# Patient Record
Sex: Male | Born: 1960 | Race: White | Hispanic: No | Marital: Single | Smoking: Never smoker
Health system: Southern US, Community
[De-identification: ages and names within clinical notes are randomized; demographics above are authoritative.]

## PROBLEM LIST (undated history)

## (undated) DIAGNOSIS — E785 Hyperlipidemia, unspecified: Secondary | ICD-10-CM

## (undated) DIAGNOSIS — I10 Essential (primary) hypertension: Secondary | ICD-10-CM

## (undated) DIAGNOSIS — M62838 Other muscle spasm: Secondary | ICD-10-CM

## (undated) DIAGNOSIS — H547 Unspecified visual loss: Secondary | ICD-10-CM

## (undated) DIAGNOSIS — K219 Gastro-esophageal reflux disease without esophagitis: Secondary | ICD-10-CM

## (undated) DIAGNOSIS — E669 Obesity, unspecified: Secondary | ICD-10-CM

## (undated) DIAGNOSIS — G931 Anoxic brain damage, not elsewhere classified: Secondary | ICD-10-CM

## (undated) HISTORY — DX: Hyperlipidemia, unspecified: E78.5

## (undated) HISTORY — DX: Essential (primary) hypertension: I10

## (undated) HISTORY — DX: Gastro-esophageal reflux disease without esophagitis: K21.9

## (undated) HISTORY — DX: Anoxic brain damage, not elsewhere classified: G93.1

## (undated) HISTORY — PX: CHOLECYSTECTOMY: SHX55

## (undated) HISTORY — PX: OTHER SURGICAL HISTORY: SHX169

## (undated) HISTORY — DX: Obesity, unspecified: E66.9

## (undated) HISTORY — DX: Unspecified visual loss: H54.7

---

## 1999-12-23 ENCOUNTER — Ambulatory Visit (HOSPITAL_COMMUNITY): Admission: RE | Admit: 1999-12-23 | Discharge: 1999-12-23 | Payer: Self-pay | Admitting: Neurosurgery

## 1999-12-23 ENCOUNTER — Encounter: Payer: Self-pay | Admitting: Neurosurgery

## 1999-12-24 ENCOUNTER — Ambulatory Visit (HOSPITAL_COMMUNITY): Admission: RE | Admit: 1999-12-24 | Discharge: 1999-12-24 | Payer: Self-pay | Admitting: Neurosurgery

## 2002-05-27 ENCOUNTER — Encounter: Payer: Self-pay | Admitting: Family Medicine

## 2002-05-27 ENCOUNTER — Ambulatory Visit (HOSPITAL_COMMUNITY): Admission: RE | Admit: 2002-05-27 | Discharge: 2002-05-27 | Payer: Self-pay | Admitting: Family Medicine

## 2002-05-29 ENCOUNTER — Encounter: Admission: RE | Admit: 2002-05-29 | Discharge: 2002-05-29 | Payer: Self-pay | Admitting: Neurosurgery

## 2002-05-29 ENCOUNTER — Encounter: Payer: Self-pay | Admitting: Neurosurgery

## 2003-02-20 ENCOUNTER — Encounter: Payer: Self-pay | Admitting: Neurosurgery

## 2003-02-20 ENCOUNTER — Encounter: Admission: RE | Admit: 2003-02-20 | Discharge: 2003-02-20 | Payer: Self-pay | Admitting: Neurosurgery

## 2003-02-25 ENCOUNTER — Ambulatory Visit (HOSPITAL_COMMUNITY): Admission: RE | Admit: 2003-02-25 | Discharge: 2003-02-25 | Payer: Self-pay | Admitting: Neurosurgery

## 2003-09-14 ENCOUNTER — Emergency Department (HOSPITAL_COMMUNITY): Admission: EM | Admit: 2003-09-14 | Discharge: 2003-09-14 | Payer: Self-pay | Admitting: Emergency Medicine

## 2003-09-16 ENCOUNTER — Ambulatory Visit (HOSPITAL_COMMUNITY): Admission: RE | Admit: 2003-09-16 | Discharge: 2003-09-16 | Payer: Self-pay | Admitting: Sports Medicine

## 2004-09-03 ENCOUNTER — Ambulatory Visit (HOSPITAL_COMMUNITY): Admission: RE | Admit: 2004-09-03 | Discharge: 2004-09-03 | Payer: Self-pay | Admitting: Family Medicine

## 2004-10-22 ENCOUNTER — Encounter: Admission: RE | Admit: 2004-10-22 | Discharge: 2004-10-22 | Payer: Self-pay | Admitting: Neurosurgery

## 2006-10-11 ENCOUNTER — Ambulatory Visit: Payer: Self-pay | Admitting: Orthopedic Surgery

## 2007-10-08 ENCOUNTER — Encounter: Admission: RE | Admit: 2007-10-08 | Discharge: 2007-10-08 | Payer: Self-pay | Admitting: Neurosurgery

## 2009-05-06 ENCOUNTER — Inpatient Hospital Stay (HOSPITAL_COMMUNITY): Admission: EM | Admit: 2009-05-06 | Discharge: 2009-05-08 | Payer: Self-pay | Admitting: Emergency Medicine

## 2009-05-08 ENCOUNTER — Encounter (INDEPENDENT_AMBULATORY_CARE_PROVIDER_SITE_OTHER): Payer: Self-pay | Admitting: General Surgery

## 2011-01-08 LAB — HEPATIC FUNCTION PANEL
ALT: 177 U/L — ABNORMAL HIGH (ref 0–53)
ALT: 287 U/L — ABNORMAL HIGH (ref 0–53)
AST: 179 U/L — ABNORMAL HIGH (ref 0–37)
AST: 199 U/L — ABNORMAL HIGH (ref 0–37)
Albumin: 3.3 g/dL — ABNORMAL LOW (ref 3.5–5.2)
Albumin: 4.2 g/dL (ref 3.5–5.2)
Alkaline Phosphatase: 132 U/L — ABNORMAL HIGH (ref 39–117)
Alkaline Phosphatase: 169 U/L — ABNORMAL HIGH (ref 39–117)
Bilirubin, Direct: 0.3 mg/dL (ref 0.0–0.3)
Bilirubin, Direct: 0.7 mg/dL — ABNORMAL HIGH (ref 0.0–0.3)
Indirect Bilirubin: 0.7 mg/dL (ref 0.3–0.9)
Indirect Bilirubin: 0.8 mg/dL (ref 0.3–0.9)
Total Bilirubin: 1.1 mg/dL (ref 0.3–1.2)
Total Bilirubin: 1.4 mg/dL — ABNORMAL HIGH (ref 0.3–1.2)
Total Protein: 6.4 g/dL (ref 6.0–8.3)
Total Protein: 7.6 g/dL (ref 6.0–8.3)

## 2011-01-08 LAB — POCT CARDIAC MARKERS
CKMB, poc: <1 ng/mL — ABNORMAL LOW (ref 1.0–8.0)
CKMB, poc: <1 ng/mL — ABNORMAL LOW (ref 1.0–8.0)
Myoglobin, poc: 66.1 ng/mL (ref 12–200)
Myoglobin, poc: 79.4 ng/mL (ref 12–200)
Troponin i, poc: <0.05 ng/mL (ref 0.00–0.09)
Troponin i, poc: <0.05 ng/mL (ref 0.00–0.09)

## 2011-01-08 LAB — GLUCOSE, CAPILLARY
Glucose-Capillary: 123 mg/dL — ABNORMAL HIGH (ref 70–99)
Glucose-Capillary: 136 mg/dL — ABNORMAL HIGH (ref 70–99)

## 2011-01-08 LAB — CBC
HCT: 38.2 % — ABNORMAL LOW (ref 39.0–52.0)
HCT: 44.6 % (ref 39.0–52.0)
Hemoglobin: 13.2 g/dL (ref 13.0–17.0)
Hemoglobin: 15.5 g/dL (ref 13.0–17.0)
MCHC: 34.5 g/dL (ref 30.0–36.0)
MCHC: 34.8 g/dL (ref 30.0–36.0)
MCV: 85.4 fL (ref 78.0–100.0)
MCV: 85.8 fL (ref 78.0–100.0)
Platelets: 229 10*3/uL (ref 150–400)
Platelets: 251 10*3/uL (ref 150–400)
RBC: 4.47 MIL/uL (ref 4.22–5.81)
RBC: 5.2 MIL/uL (ref 4.22–5.81)
RDW: 13.8 % (ref 11.5–15.5)
RDW: 14 % (ref 11.5–15.5)
WBC: 16.1 10*3/uL — ABNORMAL HIGH (ref 4.0–10.5)
WBC: 21.2 10*3/uL — ABNORMAL HIGH (ref 4.0–10.5)

## 2011-01-08 LAB — BASIC METABOLIC PANEL
BUN: 12 mg/dL (ref 6–23)
BUN: 12 mg/dL (ref 6–23)
CO2: 28 mEq/L (ref 19–32)
CO2: 30 mEq/L (ref 19–32)
Calcium: 8.8 mg/dL (ref 8.4–10.5)
Calcium: 9.5 mg/dL (ref 8.4–10.5)
Chloride: 105 mEq/L (ref 96–112)
Chloride: 99 mEq/L (ref 96–112)
Creatinine, Ser: 0.81 mg/dL (ref 0.4–1.5)
Creatinine, Ser: 1.01 mg/dL (ref 0.4–1.5)
GFR calc Af Amer: >60 mL/min (ref >60)
GFR calc Af Amer: >60 mL/min (ref >60)
GFR calc non Af Amer: >60 mL/min (ref >60)
GFR calc non Af Amer: >60 mL/min (ref >60)
Glucose, Bld: 127 mg/dL — ABNORMAL HIGH (ref 70–99)
Glucose, Bld: 209 mg/dL — ABNORMAL HIGH (ref 70–99)
Potassium: 2.9 mEq/L — ABNORMAL LOW (ref 3.5–5.1)
Potassium: 3.1 mEq/L — ABNORMAL LOW (ref 3.5–5.1)
Sodium: 140 mEq/L (ref 135–145)
Sodium: 142 mEq/L (ref 135–145)

## 2011-01-08 LAB — DIFFERENTIAL
Basophils Absolute: 0 10*3/uL (ref 0.0–0.1)
Basophils Absolute: 0 10*3/uL (ref 0.0–0.1)
Basophils Relative: 0 % (ref 0–1)
Basophils Relative: 0 % (ref 0–1)
Eosinophils Absolute: 0 10*3/uL (ref 0.0–0.7)
Eosinophils Absolute: 0 10*3/uL (ref 0.0–0.7)
Eosinophils Relative: 0 % (ref 0–5)
Eosinophils Relative: 0 % (ref 0–5)
Lymphocytes Relative: 12 % (ref 12–46)
Lymphocytes Relative: 3 % — ABNORMAL LOW (ref 12–46)
Lymphs Abs: 0.7 10*3/uL (ref 0.7–4.0)
Lymphs Abs: 2 10*3/uL (ref 0.7–4.0)
Monocytes Absolute: 0.5 10*3/uL (ref 0.1–1.0)
Monocytes Absolute: 0.9 10*3/uL (ref 0.1–1.0)
Monocytes Relative: 2 % — ABNORMAL LOW (ref 3–12)
Monocytes Relative: 6 % (ref 3–12)
Neutro Abs: 13.2 10*3/uL — ABNORMAL HIGH (ref 1.7–7.7)
Neutro Abs: 20 10*3/uL — ABNORMAL HIGH (ref 1.7–7.7)
Neutrophils Relative %: 82 % — ABNORMAL HIGH (ref 43–77)
Neutrophils Relative %: 94 % — ABNORMAL HIGH (ref 43–77)

## 2011-01-08 LAB — POTASSIUM: Potassium: 3.7 mEq/L (ref 3.5–5.1)

## 2011-01-08 LAB — LIPASE, BLOOD: Lipase: 19 U/L (ref 11–59)

## 2011-02-15 NOTE — Discharge Summary (Signed)
NAME:  [name], [name] NO.:  [id number]   MEDICAL RECORD NO.:  [id number]          PATIENT TYPE:  INP   LOCATION:  A308                          FACILITY:  [facility]   PHYSICIAN:  Dalia Heading, M.D.  DATE OF BIRTH:  Sep 22, 1961   DATE OF ADMISSION:  05/06/2009  DATE OF DISCHARGE:  08/06/2010LH                               DISCHARGE SUMMARY   HOSPITAL COURSE SUMMARY:  The patient is a 50 year old white male with  multiple medical problems, who presented with upper abdominal pain,  nausea, vomiting.  He was found on ultrasound of the gallbladder to have  acute cholecystitis with cholelithiasis.  His liver enzyme tests were  noted to be elevated.  Just prior to the surgery, his total bilirubin  returned to normal.  He subsequently went to the operating room on  May 08, 2009 underwent laparoscopic cholecystectomy.  He tolerated the  procedure well.  His postoperative course has been unremarkable.  His  diet was advanced without difficulty.   The patient is being discharged home or back to the group home in good  and stable condition.   FOLLOWUP:  The patient is to follow up Dr. Franky Macho on May 14, 2009.   Discharge medications include:  1. Darvocet-N 100 one-two tablets p.o. q.6 h p.r.n. pain.  2. Multivitamin 1 tablet p.o. daily.  3. Baby aspirin 81 mg p.o. daily.  4. HydroDIURIL/hydrochlorothiazide 25-12 or 25 mg p.o. daily.  5. Lisinopril 10 mg p.o. daily.  6. Prilosec 20 mg p.o. daily.  7. Meclizine 12.5 mg p.o. b.i.d.  8. Ibuprofen 600 mg p.o. q.8 h p.r.n.  9. Tylenol 1 tablet p.o. q.6 h p.r.n.  10.Lipitor 40 mg p.o. nightly.  11.Flexeril 10 mg p.o. q.8 h p.r.n.   PRINCIPAL DIAGNOSES:  1. Cholecystitis, cholelithiasis.  2. Anoxic brain injury.  3. Blindness.  4. Hypertension.  5. Gastric reflux disease.   PRINCIPAL PROCEDURE:  Laparoscopic cholecystectomy on May 08, 2009.      Dalia Heading, M.D.  Electronically Signed     MAJ/MEDQ  D:  05/08/2009  T:  05/08/2009  Job:  [id number]

## 2011-02-15 NOTE — Op Note (Signed)
[name], [name] NO.:  [id number]   MEDICAL RECORD NO.:  [id number]          PATIENT TYPE:  INP   LOCATION:  A308                          FACILITY:  [facility]   PHYSICIAN:  Dalia Heading, M.D.  DATE OF BIRTH:  [birth year]   DATE OF PROCEDURE:  05/08/2009  DATE OF DISCHARGE:  05/08/2009                               OPERATIVE REPORT   PREOPERATIVE DIAGNOSES:  Cholecystitis, cholelithiasis.   POSTOPERATIVE DIAGNOSES:  Cholecystitis, cholelithiasis.   PROCEDURE:  Laparoscopic cholecystectomy.   SURGEON:  Dalia Heading, MD   ANESTHESIA:  General endotracheal.   INDICATIONS:  The patient is a 50 year old white male who presented to  the emergency room with several-day history of worsening right upper  quadrant abdominal pain, nausea, vomiting.  Ultrasound of the  gallbladder revealed cholecystitis.  He did have liver enzyme tests that  were elevated.  His total bilirubin was normal just prior to the  surgery.  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  The risks and benefits  of the procedure including bleeding, infection, hepatobiliary injury,  and the possibility of an open procedure were fully explained to the  patient's mother, who gave informed consent for the patient.   PROCEDURE NOTE:  The patient was placed in supine position.  After  induction of general endotracheal anesthesia, the abdomen was prepped  and draped using the usual sterile technique with Betadine.  Surgical  site confirmation was performed.   A supraumbilical incision was made down to the fascia.  A Veress needle  was introduced into the abdominal cavity and confirmation of placement  was done using the saline drop test.  The abdomen was then insufflated  to 16 mmHg pressure.  An 11-mm trocar was introduced into the abdominal  cavity under direct visualization without difficulty.  The patient was  placed in  reversed Trendelenburg position.  An additional 11-mm trocar  was placed in the epigastric region and a 5-mm trocar was placed in the  right upper quadrant, right flank regions.  The liver was inspected and  noted to be within normal limits.  The gallbladder was noted to be  acutely inflamed and friable.  Care was taken to avoid the VP shunt that  was in the abdomen.  The gallbladder was retracted superiorly and  laterally.  The dissection was begun around the infundibulum of the  gallbladder.  The cystic duct was first identified.  Junction to the  infundibulum fully identified.  Due to its friability, a vascular Endo-  GIA was placed across the base of the cystic duct and fired.  The cystic  artery was likewise ligated divided using EndoClips.  The gallbladder  was then freed away from the gallbladder fossa using Bovie  electrocautery.  Gallbladder was then removed using an Endocatch bag  without difficulty.  Multiple small granular stones were also removed  using suction.  The abdomen was copiously irrigated with normal saline.  All fluid and air were then evacuated from the abdominal cavity prior to  removal of the trocars.   All wounds were irrigated with normal saline.  All wounds were injected  with 0.5% Sensorcaine.  The supraumbilical fascia as well as epigastric  fascia were reapproximated using 0 Vicryl interrupted sutures.  All skin  incisions were closed using staples.  Betadine ointment and dry sterile  dressings were applied.   All tape and needle counts were correct at the end of the procedure.  The patient was extubated in the operating room and went back to  recovery room in awake and stable condition.   COMPLICATIONS:  None.   SPECIMEN:  Gallbladder.   BLOOD LOSS:  Minimal.      Dalia Heading, M.D.  Electronically Signed     MAJ/MEDQ  D:  05/08/2009  T:  05/08/2009  Job:  [id number]   cc:   [facility]

## 2011-02-15 NOTE — H&P (Signed)
NAME:  [name], [name] NO.:  [id number]   MEDICAL RECORD NO.:  [id number]          PATIENT TYPE:  INP   LOCATION:  A308                          FACILITY:  [facility]   PHYSICIAN:  Dalia Heading, M.D.  DATE OF BIRTH:  [birth year]   DATE OF ADMISSION:  05/06/2009  DATE OF DISCHARGE:  LH                              HISTORY & PHYSICAL   CHIEF COMPLAINT:  Right upper quadrant abdominal pain.   HISTORY OF PRESENT ILLNESS:  The patient is a 50 year old, white male,  who presented to the emergency room for workup of upper abdominal pain.  He states he started having the abdominal pain recently, but he is not  exactly sure when.  Workup including an ultrasound of the gallbladder  revealed cholecystitis with cholelithiasis.  Common bile duct is within  normal limits.  His liver enzyme tests were also noted to be elevated.   The patient had anoxic brain injury as a child and history is limited.  His mother had stepped out from the room at the time of my examination.   PAST MEDICAL HISTORY:  Anoxic brain injury, blindness, hypertension,  gastric reflux disease.   PAST SURGICAL HISTORY:  Ventriculoperitoneal shunt in the remote past.   CURRENT MEDICATIONS:  1. Hydrochlorothiazide 25 mg p.o. daily.  2. Baby aspirin 1 tablet p.o. daily.  3. Prilosec 20 mg p.o. b.i.d.  4. Lipitor 40 mg p.o. nightly.  5. Ibuprofen 600 mg p.r.n.  6. Antivert 12.5 mg p.o. p.r.n.  7. Flexeril 10 mg p.o. p.r.n.   ALLERGIES:  PENICILLIN.   REVIEW OF SYSTEMS:  The patient does not drink or smoke.   PHYSICAL EXAMINATION:  GENERAL:  The patient is a well-developed, well-  nourished, white male, in no acute distress.  He is afebrile and vital  signs are stable.  LUNGS:  Clear to auscultation with equal breath sounds bilaterally.  HEART:  Regular rate and rhythm without S3, S4, or murmurs.  ABDOMEN:  Soft with tenderness down the right upper quadrant to  palpation.  No hepatosplenomegaly, masses,  hernias are noted.  A right  upper quadrant surgical scar is present, though it is difficult to  palpate the shunt.   White blood cell count 21.2, hemoglobin 15.5, hematocrit 44.6, platelet  count 251.  MET-7 is remarkable for a potassium of 3.1, glucose 209.  SGOT is 199, SGPT 177, alkaline phosphatase 169, total bilirubin 1.4,  direct 0.7, lipase 19.   Chest x-ray and abdominal films reveal clear lung fields.  The abdomen  reveals a VP shunt with the tip in the lower midabdomen.   IMPRESSION:  Cholecystitis, cholelithiasis.   PLAN:  The patient would be admitted to the hospital for IV antibiotic  therapy and bowel rest.  He subsequently will need laparoscopic  cholecystectomy with possible cholangiograms.  The risks and benefits of  the procedure including bleeding, infection, hepatobiliary injury, the  possibility of an open procedure will be fully explained to the  patient's mother prior to surgical intervention.      Dalia Heading, M.D.  Electronically Signed  MAJ/MEDQ  D:  05/06/2009  T:  05/07/2009  Job:  [id number]

## 2011-02-18 NOTE — Op Note (Signed)
[facility]  Patient:    [name], [name]                        MRN: [id number] Proc. Date: 12/24/99 Adm. Date:  [id number] Disc. Date: [id number] Attending:  Emeterio Reeve Dictator:   Payton Doughty, M.D.                           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  The CT was unchanged.  PROCEDURE:  Shunt tap.  DESCRIPTION OF PROCEDURE:  Following shave, prep, and drape in the usual sterile fashion, the ______ reservoir was accessed with a 23 gauge butterfly.  The pressure was very low.  Aspiration of cerebrospinal fluid showed clear fluid which was sent for cells, glucose, and protein.  He had good runoff down to a pressure of 0.  He had no complications.  He is being sent home with a follow up in my office. DD:  12/24/99 TD:  12/26/99 Job: [id number]

## 2011-02-18 NOTE — Op Note (Signed)
   NAME:  [name], [name] NO.:  [id number]   MEDICAL RECORD NO.:  [id number]                   PATIENT TYPE:  OIB   LOCATION:  2892                                 FACILITY:  [facility]   PHYSICIAN:  Payton Doughty, M.D.                   DATE OF BIRTH:  10/01/1961   DATE OF PROCEDURE:  02/25/2003  DATE OF DISCHARGE:                                 OPERATIVE REPORT   The patient is a 49 year old gentleman with shunted hydrocephalus with  neurologic change, for shunt tap.   DESCRIPTION OF PROCEDURE:  The area over the [place] reservoir was shaved  and prepped with Betadine solution.  A 23 gauge butterfly needle was  inserted into it.  The pressure was negligible.  There was CSF evacuated and  sent for cells and glucose and protein.  The pressure allowed to drop  quickly reached a height of less than the needle in the reservoir.  The  needle was withdrawn and a bandage placed.  The patient left without  difficulty.                                               Payton Doughty, M.D.    MWR/MEDQ  D:  02/25/2003  T:  02/25/2003  Job:  [id number]

## 2012-12-20 ENCOUNTER — Encounter: Payer: Self-pay | Admitting: Urgent Care

## 2012-12-20 ENCOUNTER — Encounter (HOSPITAL_COMMUNITY): Payer: Self-pay | Admitting: Pharmacy Technician

## 2012-12-20 ENCOUNTER — Other Ambulatory Visit: Payer: Self-pay | Admitting: Gastroenterology

## 2012-12-20 ENCOUNTER — Ambulatory Visit (INDEPENDENT_AMBULATORY_CARE_PROVIDER_SITE_OTHER): Payer: Medicare Other | Admitting: Urgent Care

## 2012-12-20 VITALS — BP 129/84 | HR 65 | Temp 97.6°F | Ht 66.0 in | Wt 226.4 lb

## 2012-12-20 DIAGNOSIS — R1310 Dysphagia, unspecified: Secondary | ICD-10-CM

## 2012-12-20 DIAGNOSIS — R1319 Other dysphagia: Secondary | ICD-10-CM

## 2012-12-20 DIAGNOSIS — K219 Gastro-esophageal reflux disease without esophagitis: Secondary | ICD-10-CM

## 2012-12-20 NOTE — Progress Notes (Signed)
Referring Provider: [name], MD Primary Care Physician:  [name], MD Primary Gastroenterologist:  [name]  Chief Complaint  Patient presents with  . Heartburn  . Dysphagia    HPI:  [name] is a 52 y.o. male here as a referral from Dr. Luster Landsberg for GERD.  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  He presents with mother & caregiver today.  Hx chronic GERD, worse lately.  C/o heartburn & sour belches all the time.  Mom says smells so bad she has to leave the room.  On omeprazole 20mg  BID for years.  C/o feeling like something stuck in chest & upper esophagus when eating.  He feels like he needs a drink to flush food down his esophagus after eating.   Caregiver notes he is taking IBU 400mg  every 8hrs prn headaches.  Weight steadily increasing.  Appetite is fine.   Denies constipation, diarrhea, rectal bleeding, melena or weight loss.  Past Medical History  Diagnosis Date  . Hyperlipidemia   . Obesity   . Hypertension   . Blind   . Hypoxic brain injury   . GERD (gastroesophageal reflux disease)     Past Surgical History  Procedure Laterality Date  . Cholecystectomy    . High pressure shunt      Current Outpatient Prescriptions  Medication Sig Dispense Refill  . acetaminophen (TYLENOL) 500 MG tablet Take 500 mg by mouth every 6 (six) hours as needed for pain.      Marland Kitchen aspirin 81 MG tablet Take 81 mg by mouth daily.      Marland Kitchen atorvastatin (LIPITOR) 40 MG tablet Take 40 mg by mouth daily.       . clonazePAM (KLONOPIN) 0.5 MG tablet Take 0.5 mg by mouth 2 (two) times daily as needed.       . cyclobenzaprine (FLEXERIL) 10 MG tablet Take 10 mg by mouth 3 (three) times daily as needed for muscle spasms.      . fenofibrate (TRICOR) 145 MG tablet Take 145 mg by mouth daily.       . hydrochlorothiazide (HYDRODIURIL) 25 MG tablet Take 25 mg by mouth daily.       Marland Kitchen ibuprofen (ADVIL,MOTRIN) 400 MG tablet Take 400 mg by mouth every 8 (eight) hours as needed.       .  meclizine (ANTIVERT) 25 MG tablet Take 12.5 mg by mouth 2 (two) times daily as needed.      . Multiple Vitamin (MULTIVITAMIN) capsule Take 1 capsule by mouth daily.      Marland Kitchen omeprazole (PRILOSEC) 20 MG capsule Take 20 mg by mouth 2 (two) times daily.       . potassium chloride (K-DUR) 10 MEQ tablet Take 10 mEq by mouth daily.       . pseudoephedrine-brompheniramine-dextromethorphan (CARBOFED DM) 45-4-15 MG/5ML syrup Take 5 mLs by mouth 4 (four) times daily as needed for cough.      . zolpidem (AMBIEN) 5 MG tablet Take 2.5 mg by mouth at bedtime as needed.        No current facility-administered medications for this visit.    Allergies as of 12/20/2012 - Review Complete 12/20/2012  Allergen Reaction Noted  . Penicillins Hives 12/20/2012    Family History:There is no known family history of colorectal carcinoma , liver disease, or inflammatory bowel disease.  Problem Relation Age of Onset  . Breast cancer Sister   . Breast cancer Mother   . Kidney failure Father  History   Social History  . Marital Status: Single    Spouse Name: N/A    Number of Children: 0  . Years of Education: N/A   Occupational History  . disabled    Social History Main Topics  . Smoking status: Never Smoker   . Smokeless tobacco: Not on file  . Alcohol Use: No  . Drug Use: No  . Sexually Active: Not on file   Other Topics Concern  . Not on file   Social History Narrative   Res[facility]    Review of Systems: Gen: Denies any fever, chills, sweats, anorexia, fatigue, weakness, malaise, weight loss, and sleep disorder CV: Denies chest pain, angina, palpitations, syncope, orthopnea, PND, peripheral edema, and claudication. Resp: Denies dyspnea at rest, dyspnea with exercise, cough, sputum, wheezing, coughing up blood, and pleurisy. GI: Denies vomiting blood, jaundice, and fecal incontinence.    GU : Denies urinary burning, blood in urine, urinary frequency, urinary hesitancy,  nocturnal urination, and urinary incontinence. MS: Denies joint pain, limitation of movement, and swelling, stiffness, low back pain, extremity pain. Denies muscle weakness, cramps, atrophy.  Derm: Denies rash, itching, dry skin, hives, moles, warts, or unhealing ulcers.  Psych: Denies depression, anxiety, memory loss, suicidal ideation, hallucinations, paranoia, and confusion. Heme: Denies bruising, bleeding, and enlarged lymph nodes. Neuro:  Denies any headaches, dizziness, paresthesias. Endo:  Denies any problems with DM, thyroid, adrenal function.  Physical Exam: BP 129/84  Pulse 65  Temp(Src) 97.6 F (36.4 C) (Oral)  Ht 5\' 6"  (1.676 m)  Wt 226 lb 6.4 oz (102.694 kg)  BMI 36.56 kg/m2 No LMP for male patient. General:   Alert,  Well-developed, pleasant and cooperative in NAD.  Accompanied by caregiver & mother.  +Wheelchair. Head:  +shunt Eyes:  Sclera clear, no icterus.   Conjunctiva pink.  +BLIND. Ears:  Normal auditory acuity. Nose:  No deformity, discharge, or lesions. Mouth:  No deformity or lesions,oropharynx pink & moist. Neck:  Supple; no masses or thyromegaly. Lungs:  Clear throughout to auscultation.   No wheezes, crackles, or rhonchi. No acute distress. Heart:  Regular rate and rhythm; no murmurs, clicks, rubs,  or gallops. Abdomen:  Normal bowel sounds.  No bruits.  Soft, non-tender and non-distended without masses, hepatosplenomegaly or hernias noted.  No guarding or rebound tenderness.   Rectal:  Deferred. Msk:  Symmetrical without gross deformities. Normal posture. Pulses:  Normal pulses noted. Extremities:  No edema. Neurologic:  Alert and oriented x4 Skin:  Intact without significant lesions or rashes. Lymph Nodes:  No significant cervical adenopathy. Psych:  Alert and cooperative. Normal mood and affect.

## 2012-12-20 NOTE — Assessment & Plan Note (Signed)
[name] is a pleasant 52 y.o. male with poorly controlled GERD on BID omeprazole 20mg .  He also has dysphagia to solids & liquids.  He is taking a significant amount of Ibuprofen.  Differentials include refractory GERD, esophagitis, gastritis, or PUD.  EGD with Dr Darrick Penna.  I have discussed risks & benefits which include, but are not limited to, bleeding, infection, perforation & drug reaction.  The patient agrees with this plan & written consent will be obtained.

## 2012-12-20 NOTE — Progress Notes (Signed)
Faxed to PCP

## 2012-12-20 NOTE — Patient Instructions (Addendum)
EGD with possible esophageal dilation with Dr Darrick Penna STOP OMEPRAZOLE Begin Protonix 40mg  before breakfast & dinner. Dysphagia 2/GERD diet STOP IBUPROFEN, You may use tylenol instead Dysphagia Diet Level 2, Mechanically Altered This dysphagia mechanically altered diet is restricted to:  Foods that are moist, soft-textured, and easy to chew and swallow.  Meats that are ground or minced to no larger than -inch pieces. Meats are moist with gravy or sauce added.  Foods that do not include bread or bread-like textures except soft pancakes, well-moistened with syrup or sauce.  Textures with some chewing ability required.  Casseroles without rice.  Cooked vegetables that are less than -inch in size and easily mashed with a fork. No cooked corn, peas, broccoli, cauliflower, cabbage, Brussels sprouts, asparagus, or other fibrous, non-tender, or rubbery cooked vegetables.  Canned fruit except for pineapple. Fruit must be cut into no larger than -inch pieces.  Foods that do not include nuts, seeds, coconut, or sticky textures. FOOD TEXTURES Includes all foods listed on Dysphagia Diet Level 1, Pureed, in addition to the foods listed below. Beverages  Recommended: All beverages thickened to recommended consistency with minimal amounts of texture pulp. Any texture should be suspended in the liquid and should not fall out.  Avoid: All others.  You are currently limited to one of the following liquid consistency levels:  Thin.  Nectar-like.  Honey-like.  Spoon-thick. Breads  Recommended: Soft pancakes, well-moistened with syrup or sauce.  Avoid: All others. Cereals  Recommended: Cooked cereals with little texture, including oatmeal. Unprocessed wheat bran stirred into cereals for bulk. If thin liquids are restricted, it is important that all of the liquid is absorbed into the cereal.  Avoid: All dry cereals and any cooked cereals that may contain flax seeds or other seeds or nuts.  Whole-grain, dry, or coarse cereals. Cereals with nuts, seeds, dried fruit, or coconut. Desserts  Recommended: Pudding, custard. Soft fruit pies with bottom crust only. Canned fruit (excluding pineapple). Soft, moist cakes with icing.  Avoid: Dry, coarse cakes and cookies. Anything with nuts, seeds, coconut, pineapple, or dried fruit. Breakfast yogurt with nuts. Rice or bread pudding.  These foods are considered thin liquids and should be avoided if thin liquids are restricted:  Frozen malts, milk shakes, frozen yogurt, eggnog, nutritional supplements, ice cream, sherbet, regular or sugar-free gelatin, or any foods that become thin liquid at either room temperature, 70 F (21.1 C) or body temperature, 98 F (36.7 C). Fats  Recommended: Butter, margarine, cream for cereal (depending on liquid consistency recommendations), gravy, cream sauces, sour cream, sour cream dips with soft additives, mayonnaise, salad dressings, cream cheese, cream cheese spreads with soft additives, whipped toppings.  Avoid: All fats with coarse or chunky additives. Fruits  Recommended: Soft drained, canned, or cooked fruits without seeds or skin. Fresh soft and ripe banana. Fruit juices with a small amount of pulp. If thin liquids are restricted, fruit juices should be thickened to appropriate consistency.  Avoid: Fresh or frozen fruits. Cooked fruit with skin or seeds. Dried fruits. Fresh, canned, or cooked pineapple. Meats and Meat Substitutes Meat pieces should not exceed -inch cubes and should be tender.  Recommended: Moistened ground or cooked meat, poultry, or fish. Moist ground or tender meat may be served with gravy or sauce. Casseroles without rice. Moist macaroni and cheese, well-cooked pasta with meat sauce, tuna noodle casserole, soft, moist lasagna. Moist meatballs, meatloaf, or fish loaf. Protein salads, such as tuna or egg without large chunks, celery, or onion. Cottage cheese,  smooth quiche without  large chunks. Poached, scrambled, or soft-cooked eggs (egg yolks should not be "runny" but should be moist and able to be mashed with butter, margarine, or other moisture added to them). Cook eggs to 160 F (71.1 C) or use pasteurized eggs for safety. Souffls may have small, soft chunks. Tofu. Well-cooked, slightly mashed, moist legumes such as baked beans. All meats or protein substitutes should be served with sauces or moistened to help maintain cohesiveness in the mouth.  Avoid: Dry meats and tough meats, such as bacon, sausage, hot dogs, and bratwurst. Dry casseroles or casseroles with rice or large chunks. Peanut butter. Cheese slices and cubes. Hard-cooked or crisp fried eggs. Sandwiches. Pizza. Potatoes and Starches  Recommended: Well-cooked, moistened, boiled, baked, or mashed potatoes. Well-cooked shredded hash brown potatoes that are not crisp. All potatoes need to be moist and in sauces. Well-cooked noodles in sauce. Spaetzel or soft dumplings that have been moistened with butter or gravy.  Avoid: Potato skins and chips. Fried or French-fried potatoes. Rice. Soups  Recommended: Soups with easy-to-chew or easy-to-swallow meats or vegetables. Contents in soups should be less than -inch pieces. Soups will need to be thickened to appropriate consistency if soup is thinner than prescribed liquid consistency.  Avoid: Soups with large chunks of meat and vegetables. Soups with rice, corn, peas. Vegetables  Recommended: All soft, well-cooked vegetables. Vegetables should be less than -inch pieces. They should be easily mashed with a fork.  Avoid: Cooked corn and peas. Broccoli, cabbage, Brussels sprouts, asparagus, or other fibrous, non-tender, or rubbery cooked vegetables. Miscellaneous  Recommended: Jams and preserves without seeds, jelly. Sauces or salsas with small, tender, less than -inch pieces. Soft, smooth chocolate bars that are easily chewed.  Avoid: Seeds, nuts, coconut, or  sticky foods. Chewy candies such as caramels or licorice. Document Released: 09/19/2005 Document Revised: 12/12/2011 Document Reviewed: 10/12/2009 [facility] Patient Information 2013 [place], [place].  Diet for Gastroesophageal Reflux Disease, Adult Reflux (acid reflux) is when acid from your stomach flows up into the esophagus. When acid comes in contact with the esophagus, the acid causes irritation and soreness (inflammation) in the esophagus. When reflux happens often or so severely that it causes damage to the esophagus, it is called gastroesophageal reflux disease (GERD). Nutrition therapy can help ease the discomfort of GERD. FOODS OR DRINKS TO AVOID OR LIMIT  Smoking or chewing tobacco. Nicotine is one of the most potent stimulants to acid production in the gastrointestinal tract.  Caffeinated and decaffeinated coffee and black tea.  Regular or low-calorie carbonated beverages or energy drinks (caffeine-free carbonated beverages are allowed).   Strong spices, such as black pepper, white pepper, red pepper, cayenne, curry powder, and chili powder.  Peppermint or spearmint.  Chocolate.  High-fat foods, including meats and fried foods. Extra added fats including oils, butter, salad dressings, and nuts. Limit these to less than 8 tsp per day.  Fruits and vegetables if they are not tolerated, such as citrus fruits or tomatoes.  Alcohol.  Any food that seems to aggravate your condition. If you have questions regarding your diet, call your caregiver or a registered dietitian. OTHER THINGS THAT MAY HELP GERD INCLUDE:   Eating your meals slowly, in a relaxed setting.  Eating 5 to 6 small meals per day instead of 3 large meals.  Eliminating food for a period of time if it causes distress.  Not lying down until 3 hours after eating a meal.  Keeping the head of your bed raised 6  to 9 inches (15 to 23 cm) by using a foam wedge or blocks under the legs of the bed. Lying flat may make  symptoms worse.  Being physically active. Weight loss may be helpful in reducing reflux in overweight or obese adults.  Wear loose fitting clothing EXAMPLE MEAL PLAN This meal plan is approximately 2,000 calories based on https://www.bernard.org/ meal planning guidelines. Breakfast   cup cooked oatmeal.  1 cup strawberries.  1 cup low-fat milk.  1 oz almonds. Snack  1 cup cucumber slices.  6 oz yogurt (made from low-fat or fat-free milk). Lunch  2 slice whole-wheat bread.  2 oz sliced [place].  2 tsp mayonnaise.  1 cup blueberries.  1 cup snap peas. Snack  6 whole-wheat crackers.  1 oz string cheese. Dinner   cup brown rice.  1 cup mixed veggies.  1 tsp olive oil.  3 oz grilled fish. Document Released: 09/19/2005 Document Revised: 12/12/2011 Document Reviewed: 08/05/2011 [facility] Patient Information 2013 [place], [place].

## 2012-12-24 ENCOUNTER — Telehealth: Payer: Self-pay | Admitting: Gastroenterology

## 2012-12-24 MED ORDER — PANTOPRAZOLE SODIUM 40 MG PO TBEC: 40.0000 mg | DELAYED_RELEASE_TABLET | Freq: Two times a day (BID) | ORAL | Status: DC

## 2012-12-24 NOTE — Telephone Encounter (Signed)
The caregiver of patient called to say that [facility] has not received his protonix prescription that was to be sent on Thursday last week. Please resend because she will be calling back after 2pm if they have not received it.

## 2012-12-24 NOTE — Telephone Encounter (Signed)
Looks like it was never sent. Please fax to [facility].

## 2012-12-24 NOTE — Telephone Encounter (Signed)
Faxed to [facility].

## 2013-01-04 ENCOUNTER — Encounter (HOSPITAL_COMMUNITY)
Admission: RE | Disposition: A | Discharge status: Home / Self Care | Payer: Self-pay | Source: Ambulatory Visit | Attending: Gastroenterology

## 2013-01-04 ENCOUNTER — Ambulatory Visit (HOSPITAL_COMMUNITY)
Admission: RE | Admit: 2013-01-04 | Discharge: 2013-01-04 | Disposition: A | Discharge status: Home / Self Care | Payer: PRIVATE HEALTH INSURANCE | Source: Ambulatory Visit | Attending: Gastroenterology | Admitting: Gastroenterology

## 2013-01-04 ENCOUNTER — Encounter (HOSPITAL_COMMUNITY): Payer: Self-pay | Admitting: *Deleted

## 2013-01-04 DIAGNOSIS — R1013 Epigastric pain: Secondary | ICD-10-CM | POA: Insufficient documentation

## 2013-01-04 DIAGNOSIS — D131 Benign neoplasm of stomach: Secondary | ICD-10-CM | POA: Insufficient documentation

## 2013-01-04 DIAGNOSIS — R1310 Dysphagia, unspecified: Secondary | ICD-10-CM

## 2013-01-04 DIAGNOSIS — R1319 Other dysphagia: Secondary | ICD-10-CM

## 2013-01-04 DIAGNOSIS — I10 Essential (primary) hypertension: Secondary | ICD-10-CM | POA: Insufficient documentation

## 2013-01-04 DIAGNOSIS — K2990 Gastroduodenitis, unspecified, without bleeding: Secondary | ICD-10-CM

## 2013-01-04 DIAGNOSIS — K449 Diaphragmatic hernia without obstruction or gangrene: Secondary | ICD-10-CM | POA: Insufficient documentation

## 2013-01-04 DIAGNOSIS — K2980 Duodenitis without bleeding: Secondary | ICD-10-CM | POA: Insufficient documentation

## 2013-01-04 DIAGNOSIS — K2960 Other gastritis without bleeding: Secondary | ICD-10-CM | POA: Insufficient documentation

## 2013-01-04 DIAGNOSIS — K2970 Gastritis, unspecified, without bleeding: Secondary | ICD-10-CM

## 2013-01-04 DIAGNOSIS — K219 Gastro-esophageal reflux disease without esophagitis: Secondary | ICD-10-CM

## 2013-01-04 DIAGNOSIS — K3189 Other diseases of stomach and duodenum: Secondary | ICD-10-CM | POA: Insufficient documentation

## 2013-01-04 HISTORY — PX: MALONEY DILATION: SHX5535

## 2013-01-04 HISTORY — PX: ESOPHAGOGASTRODUODENOSCOPY: SHX5428

## 2013-01-04 HISTORY — PX: SAVORY DILATION: SHX5439

## 2013-01-04 SURGERY — EGD (ESOPHAGOGASTRODUODENOSCOPY)
Anesthesia: Moderate Sedation

## 2013-01-04 MED ORDER — BUTAMBEN-TETRACAINE-BENZOCAINE 2-2-14 % EX AERO
INHALATION_SPRAY | CUTANEOUS | Status: DC | PRN
  Administered 2013-01-04: 2 via TOPICAL

## 2013-01-04 MED ORDER — PROMETHAZINE HCL 25 MG/ML IJ SOLN
INTRAMUSCULAR | Status: AC
  Filled 2013-01-04: qty 1

## 2013-01-04 MED ORDER — SODIUM CHLORIDE 0.9 % IV SOLN
INTRAVENOUS | Status: DC
  Administered 2013-01-04: 10:00:00 via INTRAVENOUS

## 2013-01-04 MED ORDER — PROMETHAZINE HCL 25 MG/ML IJ SOLN
INTRAMUSCULAR | Status: DC | PRN
  Administered 2013-01-04: 12.5 mg via INTRAVENOUS

## 2013-01-04 MED ORDER — MIDAZOLAM HCL 5 MG/5ML IJ SOLN
INTRAMUSCULAR | Status: DC | PRN
  Administered 2013-01-04 (×3): 2 mg via INTRAVENOUS

## 2013-01-04 MED ORDER — MINERAL OIL PO OIL
TOPICAL_OIL | ORAL | Status: AC
  Filled 2013-01-04: qty 30

## 2013-01-04 MED ORDER — SODIUM CHLORIDE 0.9 % IJ SOLN
INTRAMUSCULAR | Status: AC
  Filled 2013-01-04: qty 10

## 2013-01-04 MED ORDER — MIDAZOLAM HCL 5 MG/5ML IJ SOLN
INTRAMUSCULAR | Status: AC
  Filled 2013-01-04: qty 10

## 2013-01-04 MED ORDER — MEPERIDINE HCL 100 MG/ML IJ SOLN
INTRAMUSCULAR | Status: DC | PRN
  Administered 2013-01-04: 50 mg via INTRAVENOUS
  Administered 2013-01-04: 25 mg via INTRAVENOUS
  Administered 2013-01-04: 50 mg via INTRAVENOUS

## 2013-01-04 MED ORDER — STERILE WATER FOR IRRIGATION IR SOLN
Status: DC | PRN
  Administered 2013-01-04: 11:00:00

## 2013-01-04 MED ORDER — MEPERIDINE HCL 100 MG/ML IJ SOLN
INTRAMUSCULAR | Status: AC
  Filled 2013-01-04: qty 2

## 2013-01-04 NOTE — Op Note (Addendum)
[facility] [address] [place], [address]   ENDOSCOPY PROCEDURE REPORT  PATIENT: Tony, Little  MR#: [id number] BIRTHDATE: [birth year] , 51  yrs. old GENDER: Male  ENDOSCOPIST: Jonette Eva, MD REFFERED AO:[id number] Dahlia Bailiff, M.D.  PROCEDURE DATE:  01/04/2013 PROCEDURE:   EGD with biopsy and with dilatation over guidewire INDICATIONS:1.  dysphagia.   2.  heartburn.   3.  dyspepsia. MEDICATIONS: Demerol 125 mg IV, Versed 6 mg IV, and Promethazine (Phenergan) 12.5mg  IV TOPICAL ANESTHETIC: Cetacaine Spray  DESCRIPTION OF PROCEDURE:   After the risks benefits and alternatives of the procedure were thoroughly explained, informed consent was obtained.  The EG-[id number] ([id number])  endoscope was introduced through the mouth and advanced to the second portion of the duodenum. The instrument was slowly withdrawn as the mucosa was carefully examined.  Prior to withdrawal of the scope, the guidwire was placed.  The esophagus was dilated successfully.  The patient was recovered in endoscopy and discharged home in satisfactory condition.   ESOPHAGUS: The mucosa of the esophagus appeared normal.  Multiple biopsies were performed 18 AND 33 CM FROM THE TEETH. GE JXN 38 CM FROM THE TEETH.  A small hiatal hernia was noted.   STOMACH: Medium sized polyps were found in the gastric fundus and cardia.  Multiple biopsies were performed.   Moderate non-erosive gastritis (inflammation) was found in the gastric antrum.  Multiple biopsies were performed using cold forceps.   DUODENUM: A 3 mm nodule was found & removed in the duodenal bulb.  The duodenal mucosa showed no abnormalities in the 2nd part of the duodenum. Dilation was then performed at the gastroesphageal junction  Dilator: Savary over guidewire Size(s): 15-17 MM Resistance: minimal Heme: yes  COMPLICATIONS: There were no complications.  ENDOSCOPIC IMPRESSION: 1.   Small hiatal hernia 3.   GASTRIC PolypS 4.   MODERATE  Non-erosive gastritis 5.   SMALL nodule in the duodenal bulb  RECOMMENDATIONS: CONTINUE PROTONIX.  TAKE 30 MINUTES PRIOR TO MEALS TWICE DAILY. FOLLOW A LOW FAT DIET. LOSE 20 LBS. BIOPSY WILL BE BACK IN 7 DAYS. FOLLOW UP IN JUL 2014. WILL NEED BPE IF DYSPHAGIA CONTINUES.      _______________________________ Rosalie DoctorJonette Eva, MD 01/04/2013 12:01 PM Revised: 01/04/2013 12:01 PM     PATIENT NAME:  Tony, Little MR#: [id number]

## 2013-01-04 NOTE — H&P (Signed)
Primary Care Physician:  [name], MD Primary Gastroenterologist:  Dr. Darrick Penna  Pre-Procedure History & Physical: HPI:  [name] is a 52 y.o. male here for DYSPHAGIA.  Past Medical History  Diagnosis Date  . Hyperlipidemia   . Obesity   . Hypertension   . Blind   . Hypoxic brain injury   . GERD (gastroesophageal reflux disease)     Past Surgical History  Procedure Laterality Date  . Cholecystectomy    . High pressure shunt      Prior to Admission medications   Medication Sig Start Date End Date Taking? Authorizing Provider  acetaminophen (TYLENOL) 500 MG tablet Take 500 mg by mouth every 6 (six) hours as needed for pain.   Yes Historical Provider, MD  aspirin 81 MG tablet Take 81 mg by mouth daily.   Yes Historical Provider, MD  atorvastatin (LIPITOR) 40 MG tablet Take 40 mg by mouth at bedtime.  12/05/12  Yes Historical Provider, MD  clonazePAM (KLONOPIN) 0.5 MG tablet Take 0.5 mg by mouth 2 (two) times daily.  11/23/12  Yes Historical Provider, MD  cyclobenzaprine (FLEXERIL) 10 MG tablet Take 10 mg by mouth 3 (three) times daily as needed for muscle spasms.   Yes Historical Provider, MD  fenofibrate (TRICOR) 145 MG tablet Take 145 mg by mouth daily.  11/26/12  Yes Historical Provider, MD  hydrochlorothiazide (HYDRODIURIL) 25 MG tablet Take 25 mg by mouth daily.  11/26/12  Yes Historical Provider, MD  meclizine (ANTIVERT) 25 MG tablet Take 12.5 mg by mouth 2 (two) times daily.    Yes Historical Provider, MD  Multiple Vitamin (MULTIVITAMIN) capsule Take 1 capsule by mouth daily.   Yes Historical Provider, MD  pantoprazole (PROTONIX) 40 MG tablet Take 1 tablet (40 mg total) by mouth 2 (two) times daily before a meal. 12/24/12  Yes Tiffany Kocher, PA-C  potassium chloride (K-DUR) 10 MEQ tablet Take 10 mEq by mouth daily.  12/05/12  Yes Historical Provider, MD  zolpidem (AMBIEN) 5 MG tablet Take 2.5 mg by mouth at bedtime.  10/10/12  Yes Historical Provider, MD    Allergies as  of 12/20/2012 - Review Complete 12/20/2012  Allergen Reaction Noted  . Penicillins Hives 12/20/2012    Family History  Problem Relation Age of Onset  . Breast cancer Sister   . Breast cancer Mother   . Kidney failure Father     History   Social History  . Marital Status: Single    Spouse Name: N/A    Number of Children: 0  . Years of Education: N/A   Occupational History  . disabled    Social History Main Topics  . Smoking status: Never Smoker   . Smokeless tobacco: Not on file  . Alcohol Use: No  . Drug Use: No  . Sexually Active: Not on file   Other Topics Concern  . Not on file   Social History Narrative   [facility]    Review of Systems: See HPI, otherwise negative ROS   Physical Exam: BP 129/76  Pulse 61  Temp(Src) 98.2 F (36.8 C) (Oral)  Resp 20  Ht 5\' 6"  (1.676 m)  Wt 226 lb (102.513 kg)  BMI 36.49 kg/m2  SpO2 97% General:   Alert,  pleasant and cooperative in NAD Head:  Normocephalic and atraumatic. Neck:  Supple; Lungs:  Clear throughout to auscultation.    Heart:  Regular rate and rhythm. Abdomen:  Soft, nontender and nondistended. Normal  bowel sounds, without guarding, and without rebound.   Neurologic:  Alert and  oriented x4;  grossly normal neurologically.  Impression/Plan:     DYSPHAGIA  PLAN:  EGD/DIL TODAY

## 2013-01-07 ENCOUNTER — Encounter (HOSPITAL_COMMUNITY): Payer: Self-pay | Admitting: Gastroenterology

## 2013-01-09 ENCOUNTER — Telehealth: Payer: Self-pay | Admitting: Gastroenterology

## 2013-01-09 NOTE — Telephone Encounter (Signed)
Called and informed pt's mom.  

## 2013-01-09 NOTE — Telephone Encounter (Addendum)
Please call pt. His stomach Bx shows gastritis. HIS ESOPHAGUS BX SHOW INFLAMMATION FROM ACID REFLUX.   CONTINUE PROTONIX. TAKE 30 MINUTES PRIOR TO MEALS TWICE DAILY. FOLLOW A LOW FAT DIET.  LOSE WEIGHT- 20 LBS OVER THE NEXT 6 MOS. FOLLOW UP IN JUL 2014 E30 GERD/DYSPHAGIA SLF.

## 2013-01-09 NOTE — Telephone Encounter (Signed)
CC PCP 

## 2013-01-10 NOTE — Telephone Encounter (Signed)
Pt is aware of OV on 7/9 at 9 with SF

## 2013-04-10 ENCOUNTER — Encounter: Payer: Self-pay | Admitting: Gastroenterology

## 2013-04-10 ENCOUNTER — Inpatient Hospital Stay: Payer: PRIVATE HEALTH INSURANCE | Admitting: Gastroenterology

## 2013-04-11 ENCOUNTER — Encounter (INDEPENDENT_AMBULATORY_CARE_PROVIDER_SITE_OTHER): Payer: PRIVATE HEALTH INSURANCE | Admitting: Gastroenterology

## 2013-04-11 ENCOUNTER — Telehealth: Payer: Self-pay | Admitting: Gastroenterology

## 2013-04-11 NOTE — Telephone Encounter (Signed)
Patient was a no-show 

## 2013-04-11 NOTE — Telephone Encounter (Signed)
REVIEWED.  

## 2013-04-11 NOTE — Progress Notes (Signed)
error 

## 2013-05-20 ENCOUNTER — Other Ambulatory Visit: Payer: Self-pay | Admitting: Gastroenterology

## 2013-09-02 ENCOUNTER — Other Ambulatory Visit (HOSPITAL_COMMUNITY): Payer: Self-pay | Admitting: General Surgery

## 2013-09-02 DIAGNOSIS — IMO0002 Reserved for concepts with insufficient information to code with codable children: Secondary | ICD-10-CM

## 2013-09-03 ENCOUNTER — Encounter (HOSPITAL_COMMUNITY): Payer: Self-pay | Admitting: Pharmacy Technician

## 2013-09-06 ENCOUNTER — Encounter (HOSPITAL_COMMUNITY): Payer: Self-pay

## 2013-09-06 ENCOUNTER — Ambulatory Visit (HOSPITAL_COMMUNITY)
Admission: RE | Admit: 2013-09-06 | Discharge: 2013-09-06 | Disposition: A | Discharge status: Home / Self Care | Payer: PRIVATE HEALTH INSURANCE | Source: Ambulatory Visit | Attending: General Surgery | Admitting: General Surgery

## 2013-09-06 ENCOUNTER — Encounter (HOSPITAL_COMMUNITY)
Admission: RE | Admit: 2013-09-06 | Discharge: 2013-09-06 | Disposition: A | Discharge status: Home / Self Care | Payer: PRIVATE HEALTH INSURANCE | Source: Ambulatory Visit | Attending: General Surgery | Admitting: General Surgery

## 2013-09-06 ENCOUNTER — Other Ambulatory Visit: Payer: Self-pay

## 2013-09-06 DIAGNOSIS — Z01812 Encounter for preprocedural laboratory examination: Secondary | ICD-10-CM | POA: Insufficient documentation

## 2013-09-06 DIAGNOSIS — IMO0002 Reserved for concepts with insufficient information to code with codable children: Secondary | ICD-10-CM

## 2013-09-06 DIAGNOSIS — Z01818 Encounter for other preprocedural examination: Secondary | ICD-10-CM | POA: Insufficient documentation

## 2013-09-06 DIAGNOSIS — Z01810 Encounter for preprocedural cardiovascular examination: Secondary | ICD-10-CM | POA: Insufficient documentation

## 2013-09-06 DIAGNOSIS — R229 Localized swelling, mass and lump, unspecified: Secondary | ICD-10-CM | POA: Insufficient documentation

## 2013-09-06 HISTORY — DX: Other muscle spasm: M62.838

## 2013-09-06 LAB — CBC WITH DIFFERENTIAL/PLATELET
Basophils Absolute: 0.1 10*3/uL (ref 0.0–0.1)
Basophils Relative: 1 % (ref 0–1)
Eosinophils Absolute: 0.1 10*3/uL (ref 0.0–0.7)
Eosinophils Relative: 1 % (ref 0–5)
HCT: 39.5 % (ref 39.0–52.0)
Hemoglobin: 13.4 g/dL (ref 13.0–17.0)
Lymphocytes Relative: 29 % (ref 12–46)
Lymphs Abs: 2.7 10*3/uL (ref 0.7–4.0)
MCH: 28.7 pg (ref 26.0–34.0)
MCHC: 33.9 g/dL (ref 30.0–36.0)
MCV: 84.6 fL (ref 78.0–100.0)
Monocytes Absolute: 0.6 10*3/uL (ref 0.1–1.0)
Monocytes Relative: 6 % (ref 3–12)
Neutro Abs: 6.1 10*3/uL (ref 1.7–7.7)
Neutrophils Relative %: 64 % (ref 43–77)
Platelets: 315 10*3/uL (ref 150–400)
RBC: 4.67 MIL/uL (ref 4.22–5.81)
RDW: 14.2 % (ref 11.5–15.5)
WBC: 9.5 10*3/uL (ref 4.0–10.5)

## 2013-09-06 LAB — BASIC METABOLIC PANEL
BUN: 18 mg/dL (ref 6–23)
CO2: 27 mEq/L (ref 19–32)
Calcium: 10.2 mg/dL (ref 8.4–10.5)
Chloride: 100 mEq/L (ref 96–112)
Creatinine, Ser: 0.87 mg/dL (ref 0.50–1.35)
GFR calc Af Amer: >90 mL/min (ref >90)
GFR calc non Af Amer: >90 mL/min (ref >90)
Glucose, Bld: 97 mg/dL (ref 70–99)
Potassium: 3.5 mEq/L (ref 3.5–5.1)
Sodium: 138 mEq/L (ref 135–145)

## 2013-09-06 NOTE — Patient Instructions (Signed)
    [name]  09/06/2013   Your procedure is scheduled on:   09/12/2013  Report to [facility] at  615  AM.  Call this number if you have problems the morning of surgery: [phone]   Remember:   Do not eat food or drink liquids after midnight.   Take these medicines the morning of surgery with A SIP OF WATER: klonopin, nexium, meclizine, tylenol #3, flexaril   Do not wear jewelry, make-up or nail polish.  Do not wear lotions, powders, or perfumes.   Do not shave 48 hours prior to surgery. Men may shave face and neck.  Do not bring valuables to the hospital.  [facility] is not responsible for any belongings or valuables.               Contacts, dentures or bridgework may not be worn into surgery.  Leave suitcase in the car. After surgery it may be brought to your room.  For patients admitted to the hospital, discharge time is determined by your treatment team.               Patients discharged the day of surgery will not be allowed to drive home.  Name and phone number of your driver: family  Special Instructions: Shower using CHG 2 nights before surgery and the night before surgery.  If you shower the day of surgery use CHG.  Use special wash - you have one bottle of CHG for all showers.  You should use approximately 1/3 of the bottle for each shower.   Please read over the following fact sheets that you were given: Pain Booklet, Coughing and Deep Breathing, MRSA Information, Surgical Site Infection Prevention, Anesthesia Post-op Instructions and Care and Recovery After Surgery PATIENT INSTRUCTIONS POST-ANESTHESIA  IMMEDIATELY FOLLOWING SURGERY:  Do not drive or operate machinery for the first twenty four hours after surgery.  Do not make any important decisions for twenty four hours after surgery or while taking narcotic pain medications or sedatives.  If you develop intractable nausea and vomiting or a severe headache please notify your doctor immediately.  FOLLOW-UP:  Please  make an appointment with your surgeon as instructed. You do not need to follow up with anesthesia unless specifically instructed to do so.  WOUND CARE INSTRUCTIONS (if applicable):  Keep a dry clean dressing on the anesthesia/puncture wound site if there is drainage.  Once the wound has quit draining you may leave it open to air.  Generally you should leave the bandage intact for twenty four hours unless there is drainage.  If the epidural site drains for more than 36-48 hours please call the anesthesia department.  QUESTIONS?:  Please feel free to call your physician or the hospital operator if you have any questions, and they will be happy to assist you.
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
## 2013-09-12 ENCOUNTER — Ambulatory Visit (HOSPITAL_COMMUNITY)
Admission: RE | Admit: 2013-09-12 | Discharge: 2013-09-13 | Disposition: A | Discharge status: Home / Self Care | Payer: PRIVATE HEALTH INSURANCE | Source: Ambulatory Visit | Attending: General Surgery | Admitting: General Surgery

## 2013-09-12 ENCOUNTER — Encounter (HOSPITAL_COMMUNITY)
Admission: RE | Disposition: A | Discharge status: Home / Self Care | Payer: Self-pay | Source: Ambulatory Visit | Attending: General Surgery

## 2013-09-12 ENCOUNTER — Ambulatory Visit (HOSPITAL_COMMUNITY): Payer: PRIVATE HEALTH INSURANCE | Admitting: Anesthesiology

## 2013-09-12 ENCOUNTER — Encounter (HOSPITAL_COMMUNITY): Payer: Self-pay | Admitting: *Deleted

## 2013-09-12 ENCOUNTER — Encounter (HOSPITAL_COMMUNITY): Payer: PRIVATE HEALTH INSURANCE | Admitting: Anesthesiology

## 2013-09-12 DIAGNOSIS — R2230 Localized swelling, mass and lump, unspecified upper limb: Secondary | ICD-10-CM | POA: Diagnosis present

## 2013-09-12 DIAGNOSIS — R1310 Dysphagia, unspecified: Secondary | ICD-10-CM

## 2013-09-12 DIAGNOSIS — D1739 Benign lipomatous neoplasm of skin and subcutaneous tissue of other sites: Secondary | ICD-10-CM | POA: Insufficient documentation

## 2013-09-12 DIAGNOSIS — K219 Gastro-esophageal reflux disease without esophagitis: Secondary | ICD-10-CM

## 2013-09-12 HISTORY — PX: MASS EXCISION: SHX2000

## 2013-09-12 SURGERY — EXCISION MASS
Anesthesia: General | Site: Shoulder | Laterality: Left

## 2013-09-12 MED ORDER — FENTANYL CITRATE 0.05 MG/ML IJ SOLN: 25.0000 ug | INTRAMUSCULAR | Status: DC | PRN

## 2013-09-12 MED ORDER — STERILE WATER FOR IRRIGATION IR SOLN
Status: DC | PRN
  Administered 2013-09-12 (×2): 1000 mL

## 2013-09-12 MED ORDER — PANTOPRAZOLE SODIUM 40 MG PO TBEC
40.0000 mg | DELAYED_RELEASE_TABLET | Freq: Every day | ORAL | Status: DC
  Administered 2013-09-12 – 2013-09-13 (×2): 40 mg via ORAL
  Filled 2013-09-12 (×2): qty 1

## 2013-09-12 MED ORDER — FENOFIBRATE 160 MG PO TABS
160.0000 mg | ORAL_TABLET | Freq: Every day | ORAL | Status: DC
  Administered 2013-09-12 – 2013-09-13 (×2): 160 mg via ORAL
  Filled 2013-09-12 (×5): qty 1

## 2013-09-12 MED ORDER — LACTATED RINGERS IV SOLN
INTRAVENOUS | Status: DC
  Administered 2013-09-12: 07:00:00 via INTRAVENOUS

## 2013-09-12 MED ORDER — EPHEDRINE SULFATE 50 MG/ML IJ SOLN
INTRAMUSCULAR | Status: AC
  Filled 2013-09-12: qty 1

## 2013-09-12 MED ORDER — CIPROFLOXACIN IN D5W 200 MG/100ML IV SOLN
200.0000 mg | Freq: Two times a day (BID) | INTRAVENOUS | Status: DC
  Administered 2013-09-12: 200 mg via INTRAVENOUS
  Filled 2013-09-12: qty 100

## 2013-09-12 MED ORDER — ONDANSETRON HCL 4 MG/2ML IJ SOLN: 4.0000 mg | Freq: Four times a day (QID) | INTRAMUSCULAR | Status: DC | PRN

## 2013-09-12 MED ORDER — CLONAZEPAM 0.5 MG PO TABS
0.5000 mg | ORAL_TABLET | Freq: Two times a day (BID) | ORAL | Status: DC
  Administered 2013-09-12 – 2013-09-13 (×3): 0.5 mg via ORAL
  Filled 2013-09-12 (×3): qty 1

## 2013-09-12 MED ORDER — ATORVASTATIN CALCIUM 40 MG PO TABS
40.0000 mg | ORAL_TABLET | Freq: Every day | ORAL | Status: DC
  Administered 2013-09-12: 40 mg via ORAL
  Filled 2013-09-12: qty 1

## 2013-09-12 MED ORDER — CALCIUM GLUCONATE 500 MG PO TABS: 2.0000 | ORAL_TABLET | Freq: Three times a day (TID) | ORAL | Status: DC

## 2013-09-12 MED ORDER — CYCLOBENZAPRINE HCL 10 MG PO TABS
10.0000 mg | ORAL_TABLET | Freq: Three times a day (TID) | ORAL | Status: DC | PRN
  Administered 2013-09-12: 10 mg via ORAL
  Filled 2013-09-12: qty 1

## 2013-09-12 MED ORDER — SUCCINYLCHOLINE CHLORIDE 20 MG/ML IJ SOLN
INTRAMUSCULAR | Status: AC
  Filled 2013-09-12: qty 1

## 2013-09-12 MED ORDER — ONDANSETRON HCL 4 MG/2ML IJ SOLN
4.0000 mg | Freq: Once | INTRAMUSCULAR | Status: AC
  Administered 2013-09-12: 4 mg via INTRAVENOUS
  Filled 2013-09-12: qty 2

## 2013-09-12 MED ORDER — EPHEDRINE SULFATE 50 MG/ML IJ SOLN
INTRAMUSCULAR | Status: DC | PRN
  Administered 2013-09-12 (×4): 10 mg via INTRAVENOUS
  Administered 2013-09-12: 5 mg via INTRAVENOUS
  Administered 2013-09-12: 10 mg via INTRAVENOUS

## 2013-09-12 MED ORDER — MELATONIN 3 MG PO CAPS: 1.0000 | ORAL_CAPSULE | Freq: Every day | ORAL | Status: DC

## 2013-09-12 MED ORDER — ONDANSETRON HCL 4 MG/2ML IJ SOLN: 4.0000 mg | Freq: Once | INTRAMUSCULAR | Status: DC | PRN

## 2013-09-12 MED ORDER — BACITRACIN-NEOMYCIN-POLYMYXIN 400-5-5000 EX OINT
TOPICAL_OINTMENT | CUTANEOUS | Status: DC | PRN
  Administered 2013-09-12: 1 via TOPICAL

## 2013-09-12 MED ORDER — ONDANSETRON HCL 4 MG PO TABS
4.0000 mg | ORAL_TABLET | Freq: Four times a day (QID) | ORAL | Status: DC | PRN
  Filled 2013-09-12: qty 1

## 2013-09-12 MED ORDER — DEXAMETHASONE SODIUM PHOSPHATE 4 MG/ML IJ SOLN
4.0000 mg | Freq: Once | INTRAMUSCULAR | Status: AC
  Administered 2013-09-12: 4 mg via INTRAVENOUS
  Filled 2013-09-12: qty 1

## 2013-09-12 MED ORDER — PROPOFOL 10 MG/ML IV BOLUS
INTRAVENOUS | Status: DC | PRN
  Administered 2013-09-12: 50 mg via INTRAVENOUS
  Administered 2013-09-12: 150 mg via INTRAVENOUS
  Administered 2013-09-12: 50 mg via INTRAVENOUS

## 2013-09-12 MED ORDER — POTASSIUM CHLORIDE CRYS ER 10 MEQ PO TBCR
10.0000 meq | EXTENDED_RELEASE_TABLET | Freq: Every day | ORAL | Status: DC
  Administered 2013-09-12 – 2013-09-13 (×2): 10 meq via ORAL
  Filled 2013-09-12 (×2): qty 1

## 2013-09-12 MED ORDER — SUCCINYLCHOLINE CHLORIDE 20 MG/ML IJ SOLN
INTRAMUSCULAR | Status: DC | PRN
  Administered 2013-09-12: 140 mg via INTRAVENOUS

## 2013-09-12 MED ORDER — CALCIUM CARBONATE 1250 (500 CA) MG PO TABS
1.0000 | ORAL_TABLET | Freq: Every day | ORAL | Status: DC
  Administered 2013-09-12 – 2013-09-13 (×2): 500 mg via ORAL
  Filled 2013-09-12 (×2): qty 1

## 2013-09-12 MED ORDER — MECLIZINE HCL 12.5 MG PO TABS
25.0000 mg | ORAL_TABLET | Freq: Two times a day (BID) | ORAL | Status: DC
  Administered 2013-09-12 – 2013-09-13 (×3): 25 mg via ORAL
  Filled 2013-09-12 (×3): qty 2

## 2013-09-12 MED ORDER — POTASSIUM CHLORIDE IN NACL 20-0.9 MEQ/L-% IV SOLN
INTRAVENOUS | Status: DC
  Administered 2013-09-12: 12:00:00 via INTRAVENOUS

## 2013-09-12 MED ORDER — FENOFIBRATE 54 MG PO TABS: 54.0000 mg | ORAL_TABLET | Freq: Every day | ORAL | Status: DC

## 2013-09-12 MED ORDER — PROPOFOL 10 MG/ML IV BOLUS
INTRAVENOUS | Status: AC
  Filled 2013-09-12: qty 20

## 2013-09-12 MED ORDER — LIDOCAINE HCL (PF) 1 % IJ SOLN
INTRAMUSCULAR | Status: AC
  Filled 2013-09-12: qty 5

## 2013-09-12 MED ORDER — BACITRACIN-NEOMYCIN-POLYMYXIN 400-5-5000 EX OINT
TOPICAL_OINTMENT | CUTANEOUS | Status: AC
  Filled 2013-09-12: qty 1

## 2013-09-12 MED ORDER — BUPIVACAINE HCL (PF) 0.5 % IJ SOLN
INTRAMUSCULAR | Status: AC
  Filled 2013-09-12: qty 30

## 2013-09-12 MED ORDER — HYDROCHLOROTHIAZIDE 25 MG PO TABS
25.0000 mg | ORAL_TABLET | Freq: Every day | ORAL | Status: DC
  Administered 2013-09-12 – 2013-09-13 (×2): 25 mg via ORAL
  Filled 2013-09-12 (×2): qty 1

## 2013-09-12 MED ORDER — FENTANYL CITRATE 0.05 MG/ML IJ SOLN
INTRAMUSCULAR | Status: AC
  Filled 2013-09-12: qty 2

## 2013-09-12 MED ORDER — LIDOCAINE HCL 1 % IJ SOLN
INTRAMUSCULAR | Status: DC | PRN
  Administered 2013-09-12: 30 mg via INTRADERMAL

## 2013-09-12 MED ORDER — ACETAMINOPHEN-CODEINE #3 300-30 MG PO TABS: 1.0000 | ORAL_TABLET | Freq: Four times a day (QID) | ORAL | Status: DC | PRN

## 2013-09-12 MED ORDER — MIDAZOLAM HCL 2 MG/2ML IJ SOLN
1.0000 mg | INTRAMUSCULAR | Status: DC | PRN
  Administered 2013-09-12: 2 mg via INTRAVENOUS
  Filled 2013-09-12: qty 2

## 2013-09-12 MED ORDER — 0.9 % SODIUM CHLORIDE (POUR BTL) OPTIME
TOPICAL | Status: DC | PRN
  Administered 2013-09-12: 1000 mL

## 2013-09-12 MED ORDER — FENTANYL CITRATE 0.05 MG/ML IJ SOLN
INTRAMUSCULAR | Status: DC | PRN
  Administered 2013-09-12: 100 ug via INTRAVENOUS
  Administered 2013-09-12: 50 ug via INTRAVENOUS

## 2013-09-12 SURGICAL SUPPLY — 38 items
0.5% MARCAINE 30ML IMPLANT
ATTRACTOMAT 16X20 MAGNETIC DRP (DRAPES) ×1 IMPLANT
BAG HAMPER (MISCELLANEOUS) ×2 IMPLANT
BLADE HEX COATED 2.75 (ELECTRODE) ×1 IMPLANT
CLEANER TIP ELECTROSURG 2X2 (MISCELLANEOUS) ×1 IMPLANT
CLOTH BEACON ORANGE TIMEOUT ST (SAFETY) ×2 IMPLANT
COVER LIGHT HANDLE STERIS (MISCELLANEOUS) ×4 IMPLANT
DECANTER SPIKE VIAL GLASS SM (MISCELLANEOUS) ×1 IMPLANT
DRSG TEGADERM 4X10 (GAUZE/BANDAGES/DRESSINGS) ×3 IMPLANT
DRSG TEGADERM 4X4.75 (GAUZE/BANDAGES/DRESSINGS) ×2 IMPLANT
ELECT REM PT RETURN 9FT ADLT (ELECTROSURGICAL) ×2
ELECTRODE REM PT RTRN 9FT ADLT (ELECTROSURGICAL) ×1 IMPLANT
EVACUATOR DRAINAGE 10X20 100CC (DRAIN) IMPLANT
EVACUATOR SILICONE 100CC (DRAIN) ×2
GLOVE BIOGEL PI IND STRL 7.0 (GLOVE) IMPLANT
GLOVE BIOGEL PI INDICATOR 7.0 (GLOVE) ×2
GLOVE ECLIPSE 6.5 STRL STRAW (GLOVE) ×1 IMPLANT
GLOVE EXAM NITRILE LRG STRL (GLOVE) ×1 IMPLANT
GLOVE SKINSENSE NS SZ7.0 (GLOVE) ×1
GLOVE SKINSENSE STRL SZ7.0 (GLOVE) ×1 IMPLANT
GOWN STRL REIN XL XLG (GOWN DISPOSABLE) ×6 IMPLANT
KIT ROOM TURNOVER APOR (KITS) ×2 IMPLANT
MANIFOLD NEPTUNE II (INSTRUMENTS) ×2 IMPLANT
NS IRRIG 1000ML POUR BTL (IV SOLUTION) ×2 IMPLANT
PACK MINOR (CUSTOM PROCEDURE TRAY) ×1 IMPLANT
PAD ABD 5X9 TENDERSORB (GAUZE/BANDAGES/DRESSINGS) ×2 IMPLANT
PAD ARMBOARD 7.5X6 YLW CONV (MISCELLANEOUS) ×2 IMPLANT
SET BASIN LINEN APH (SET/KITS/TRAYS/PACK) ×2 IMPLANT
SPONGE DRAIN TRACH 4X4 STRL 2S (GAUZE/BANDAGES/DRESSINGS) ×1 IMPLANT
SPONGE GAUZE 4X4 12PLY (GAUZE/BANDAGES/DRESSINGS) ×2 IMPLANT
SPONGE LAP 18X18 X RAY DECT (DISPOSABLE) ×2 IMPLANT
SUT ETHILON 3 0 FSL (SUTURE) ×3 IMPLANT
SUT ETHILON 4 0 PS 2 18 (SUTURE) ×3 IMPLANT
SUT SILK 2 0 (SUTURE) ×2
SUT SILK 2-0 18XBRD TIE 12 (SUTURE) IMPLANT
TAPE CLOTH SURG 4X10 WHT LF (GAUZE/BANDAGES/DRESSINGS) ×1 IMPLANT
TOWEL OR 17X26 4PK STRL BLUE (TOWEL DISPOSABLE) ×2 IMPLANT
WATER STERILE IRR 1000ML POUR (IV SOLUTION) ×4 IMPLANT

## 2013-09-12 NOTE — Progress Notes (Signed)
PHARMACIST - PHYSICIAN ORDER COMMUNICATION  CONCERNING: P&T Medication Policy on Herbal Medications  DESCRIPTION:  This patient's order for:  Melatonin 3mg   has been noted.  This product(s) is classified as an "herbal" or natural product. Due to a lack of definitive safety studies or FDA approval, nonstandard manufacturing practices, plus the potential risk of unknown drug-drug interactions while on inpatient medications, the Pharmacy and Therapeutics Committee does not permit the use of "herbal" or natural products of this type within [facility].   ACTION TAKEN: The pharmacy department is unable to verify this order at this time and your patient has been informed of this safety policy. Please reevaluate patient's clinical condition at discharge and address if the herbal or natural product(s) should be resumed at that time.   

## 2013-09-12 NOTE — Anesthesia Postprocedure Evaluation (Signed)
  Anesthesia Post-op Note  Patient: [name]  Procedure(s) Performed: Procedure(s): EXCISION NEOPLASM LEFT SHOULDER (Left)  Patient Location: PACU  Anesthesia Type:General  Level of Consciousness: awake and alert   Airway and Oxygen Therapy: Patient Spontanous Breathing and Patient connected to face mask oxygen  Post-op Pain: none  Post-op Assessment: Post-op Vital signs reviewed, Patient's Cardiovascular Status Stable, Respiratory Function Stable, Patent Airway and No signs of Nausea or vomiting  Post-op Vital Signs: Reviewed and stable  Complications: No apparent anesthesia complications

## 2013-09-12 NOTE — Progress Notes (Signed)
UR chart review completed.  

## 2013-09-12 NOTE — Preoperative (Signed)
Beta Blockers   Reason not to administer Beta Blockers:Not Applicable 

## 2013-09-12 NOTE — Progress Notes (Signed)
Post OP check  Filed Vitals:   09/12/13 1417  BP: 112/60  Pulse: 102  Temp: 98.9 F (37.2 C)  Resp: 18     Dressings dry and in tact.   Minimal JP drainage.  Minimal discomfort.  Doing well post op; home tomorrow after removal of drain.

## 2013-09-12 NOTE — Progress Notes (Signed)
52 yr old disabled W. Male with giant lipoma of left for excision.  Co morbidities discussed with anaesthesia and risks and procedure discussed with family who have signed informed consent. Labs reviewed and surgical site marked.  H&P dictated and there has been no change clinically.  Filed Vitals:   09/12/13 0638  Pulse: 63  Temp: 98 F (36.7 C)  Resp: 18  BP 138/77  O2 sat 97%

## 2013-09-12 NOTE — Transfer of Care (Signed)
Immediate Anesthesia Transfer of Care Note  Patient: [name]  Procedure(s) Performed: Procedure(s): EXCISION NEOPLASM LEFT SHOULDER (Left)  Patient Location: PACU  Anesthesia Type:General  Level of Consciousness: awake  Airway & Oxygen Therapy: Patient Spontanous Breathing and Patient connected to face mask oxygen  Post-op Assessment: Report given to PACU RN and Post -op Vital signs reviewed and stable  Post vital signs: Reviewed and stable  Complications: No apparent anesthesia complications

## 2013-09-12 NOTE — Anesthesia Preprocedure Evaluation (Signed)
Anesthesia Evaluation  Patient identified by MRN, date of birth, ID band Patient awake    Reviewed: Allergy & Precautions, H&P , NPO status , Patient's Chart, lab work & pertinent test results  Airway Mallampati: II TM Distance: >3 FB     Dental  (+) Teeth Intact   Pulmonary neg pulmonary ROS,  trach in 1976 - well healed  breath sounds clear to auscultation        Cardiovascular hypertension, Rhythm:Regular Rate:Normal     Neuro/Psych Well Controlled,  Anoxic brain injury at age 52 Reduced mental function, blindness. Vertigo     GI/Hepatic GERD-  Medicated and Controlled,  Endo/Other    Renal/GU      Musculoskeletal   Abdominal   Peds  Hematology   Anesthesia Other Findings   Reproductive/Obstetrics                           Anesthesia Physical Anesthesia Plan  ASA: III  Anesthesia Plan: General   Post-op Pain Management:    Induction: Intravenous  Airway Management Planned: Oral ETT  Additional Equipment:   Intra-op Plan:   Post-operative Plan: Extubation in OR  Informed Consent: I have reviewed the patients History and Physical, chart, labs and discussed the procedure including the risks, benefits and alternatives for the proposed anesthesia with the patient or authorized representative who has indicated his/her understanding and acceptance.     Plan Discussed with:   Anesthesia Plan Comments: (RLD position )        Anesthesia Quick Evaluation

## 2013-09-12 NOTE — Brief Op Note (Signed)
09/12/2013  9:22 AM  PATIENT:  [name]  52 y.o. male  PRE-OPERATIVE DIAGNOSIS:  neoplasm left shoulder  POST-OPERATIVE DIAGNOSIS:  neoplasm left shoulder  PROCEDURE:  Procedure(s): EXCISION NEOPLASM LEFT SHOULDER (Left)  SURGEON:  Surgeon(s) and Role:    * Marlane Hatcher, MD - Primary  PHYSICIAN ASSISTANT:   ASSISTANTS: none   ANESTHESIA:   general  EBL:  Total I/O In: 1000 [I.V.:1000] Out: -   BLOOD ADMINISTERED:none  DRAINS: Penrose drain in the Left shoulder subcutaneous space.   LOCAL MEDICATIONS USED:  NONE  SPECIMEN:  Source of Specimen:  Left shoulder.  DISPOSITION OF SPECIMEN:  PATHOLOGY  COUNTS:  YES  TOURNIQUET:  * No tourniquets in log *  DICTATION: .Other Dictation: Dictation Number OR Dict. # [id number].  PLAN OF CARE: Admit for overnight observation  PATIENT DISPOSITION:  PACU - hemodynamically stable.   Delay start of Pharmacological VTE agent (>24hrs) due to surgical blood loss or risk of bleeding: not applicable

## 2013-09-12 NOTE — Anesthesia Procedure Notes (Signed)
Procedure Name: Intubation Date/Time: 09/12/2013 7:43 AM Performed by: Glendora Score A Pre-anesthesia Checklist: Patient identified, Emergency Drugs available, Suction available and Patient being monitored Patient Re-evaluated:Patient Re-evaluated prior to inductionOxygen Delivery Method: Circle system utilized Preoxygenation: Pre-oxygenation with 100% oxygen Intubation Type: IV induction and Rapid sequence Tube type: Oral Tube size: 7.0 mm Number of attempts: 1 Airway Equipment and Method: Stylet and Video-laryngoscopy Placement Confirmation: ETT inserted through vocal cords under direct vision,  positive ETCO2 and CO2 detector Secured at: 23 cm Tube secured with: Tape Dental Injury: Teeth and Oropharynx as per pre-operative assessment

## 2013-09-13 MED FILL — Bupivacaine HCl Preservative Free (PF) Inj 0.5%: INTRAMUSCULAR | Qty: 30 | Status: AC

## 2013-09-13 NOTE — Progress Notes (Signed)
POD # 1  Filed Vitals:   09/13/13 1452  BP: 136/86  Pulse: 70  Temp: 97.5 F (36.4 C)  Resp: 18    Pt doing well post op.  Minimal discomfort.  Only 20 cc of serosanguinous drainage since this AM.  JP drain removed.  Discharge and follow up arranged.  Discharge dict. Note [id number].

## 2013-09-13 NOTE — Care Management Note (Signed)
    Page 1 of 1   09/13/2013     10:19:59 AM   CARE MANAGEMENT NOTE 09/13/2013  Patient:  [name], [name]   Account Number:  [id number]  Date Initiated:  09/13/2013  Documentation initiated by:  Sharrie Rothman  Subjective/Objective Assessment:   Pt admitted from [facility]. Pt will return to facility at discharge.     Action/Plan:   CSW to arrange discharge to facility when medically stable.   Anticipated DC Date:  09/13/2013   Anticipated DC Plan:  ASSISTED LIVING / REST HOME  In-house referral  Clinical Social Worker      DC Planning Services  CM consult      Choice offered to / List presented to:             Status of service:  Completed, signed off Medicare Important Message given?   (If response is "NO", the following Medicare IM given date fields will be blank) Date Medicare IM given:   Date Additional Medicare IM given:    Discharge Disposition:  ASSISTED LIVING  Per UR Regulation:    If discussed at Long Length of Stay Meetings, dates discussed:    Comments:  09/13/13 1020 Arlyss Queen, RN BSN CM

## 2013-09-13 NOTE — Op Note (Signed)
NAME:  [name], [name] NO.:  [id number]  MEDICAL RECORD NO.:  [id number]  LOCATION:  A304                          FACILITY:  [facility]  PHYSICIAN:  Barbaraann Barthel, M.D. DATE OF BIRTH:  1961/09/06  DATE OF PROCEDURE:  09/12/2013 DATE OF DISCHARGE:                              OPERATIVE REPORT   DIAGNOSIS:  Giant lipoma, left shoulder (approximately between the size of a softball and a volleyball).  PROCEDURE:  Excision of giant lipoma, left shoulder.  WOUND CLASSIFICATION:  Clean.  SPECIMEN:  Large fatty neoplasm, left shoulder.  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  He was referred with a large mass that was making his ability to lie down and to move his left shoulder group uncomfortable and we had planned to remove this as an outpatient.  I have discussed this with his caregiver and power of attorney, and complications not limited to, but including bleeding and infection, possibility of more surgery might be require and the need for drain were addressed.  Informed consent was obtained.  GROSS OPERATIVE FINDINGS:  Very large lipoma, slightly smaller than a volleyball was removed.  There were no other complications or abnormalities.  TECHNIQUE:  The patient was placed in the right lateral decubitus position.  After endotracheal intubation, his left shoulder was prepped with Betadine solution and draped in usual manner.  Curvilinear incision was carried out along this mass and with tedious dissection, a large mass was removed from his subcutaneous space from just his posterior axillary line moving anteriorly or laterally towards his deltoid area. This was removed and bleeding was controlled, ligating some bleeders with 2-0 silk and using the cautery to control bleeding.  Total blood loss was less than 50 mL.   Because this was such a large lesion, I elected to leave a Jackson-Pratt drain in the subcutaneous space.  The wound was closed with 4-0 and 3-0 nylon.  The drain was sutured in place with 3-0 nylon.  The Jackson-Pratt drain was placed through a separate stab wound incision.  Sterile dressing was applied with 4x4s, ABD pads and Tegaderm dressings. Prior to closure, all sponge, needle, and instrument counts were found to be correct.  Estimated blood loss was as stated less than 50 mL. There were no complications.  I will keep the patient in the hospital for observation.  Obviously, this patient is difficult to transport and logistics are difficult in this area, and I think it is reasonable to keep him under these conditions under 24-hour observation.  I will probably remove the drain in the morning.     Barbaraann Barthel, M.D.     WB/MEDQ  D:  09/12/2013  T:  09/13/2013  Job:  [id number]  cc:   [facility]

## 2013-09-13 NOTE — Discharge Summary (Signed)
NAMEMarland Kitchen  LASHAUN, KRAPF NO.:  [id number]  MEDICAL RECORD NO.:  [id number]  LOCATION:  A304                          FACILITY:  [facility]  PHYSICIAN:  Barbaraann Barthel, M.D. DATE OF BIRTH:  December 18, 1960  DATE OF ADMISSION:  09/12/2013 DATE OF DISCHARGE:  12/12/2014LH                              DISCHARGE SUMMARY   DIAGNOSIS:  Giant lipoma, left shoulder.  SECONDARY DIAGNOSES: 1. Toxic-anoxic encephalopathy. 2. Blindness. 3. Obesity. 4. Hyperlipidemia. 5. Hypertension. 6. History of gastroesophageal reflux disease.  NOTE:  This is a 52 year old white male who is pretty much confined to a wheelchair and has suffered in the past severe brain injury and is in a nursing home type of an environment who was referred for a giant neoplasm of the left shoulder, which on MRI looked to be a giant lipoma. He was taken to surgery via the outpatient department at which time I removed this lesion on September 13, 2013.  This required the placement of a Jackson-Pratt drain afterwards and a very large incision as this lesion was just larger than a softball, but somewhat smaller than a volleyball.  This was removed and surprisingly he had very little Jackson-Pratt drainage in the last almost 8 hours, he had less than 20 mL serosanguineous drainage.  The drain was removed and the wound was redressed and he will return to his nursing home facility.  We will follow up with him on Monday and these arrangements have been made with his mother who is his power of attorney and caregiver when he is out of the nursing home environment.  Hospital course was completely uneventful.  At the time of discharge, he was afebrile, his wound was clean, and he had no new neurological findings or no new complaints.  We will follow up with him perioperatively after which he is to continue to be followed by his own physicians at the [facility].     Barbaraann Barthel,  M.D.    WB/MEDQ  D:  09/13/2013  T:  09/13/2013  Job:  [id number]  cc:   [facility]

## 2013-09-13 NOTE — Clinical Social Work Note (Signed)
Pt d/c today back to facility. Pt, pt's mother, and facility aware and agreeable. Staff at group home spoke with administrator and are agreeable to no FL2, but will need d/c summary regardless per state. They are aware that AVS is available with medications and dressing change information, but this is not sufficient. D/C summary prioritized and RN notified that when d/c summary is available, to print and send with pt. Mother will provide transport.  Derenda Fennel, [place] [phone]

## 2013-09-13 NOTE — Clinical Social Work Psychosocial (Signed)
    Clinical Social Work Department BRIEF PSYCHOSOCIAL ASSESSMENT 09/13/2013  Patient:  [name], [name]     Account Number:  [id number]     Admit date:  09/12/2013  Clinical Social Worker:  Santa Genera, CLINICAL SOCIAL WORKER  Date/Time:  09/13/2013 09:00 AM  Referred by:  CSW  Date Referred:  09/13/2013 Referred for  ALF Placement   Other Referral:   Interview type:  Patient Other interview type:   Also spoke w mother in room    PSYCHOSOCIAL DATA Living Status:  FACILITY Admitted from facility:  OTHER Level of care:   Primary support name:  [name] Primary support relationship to patient:  PARENT Degree of support available:   From [name], [place]    CURRENT CONCERNS Current Concerns  Post-Acute Placement   Other Concerns:    SOCIAL WORK ASSESSMENT / PLAN CSW met w patient at bedside, patient alert and oriented. 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  Patient is post op from shoulder surgery yesterday, plan is to return to group home today.    Dependent on wheelchair due to vision limitations, limited assist w all ADLs, right side of body is less functional than left side.  Per group home, patient has good mental function, reads adult level books for the blind, memory is intact, comprehends at adult level.  Patient requires assistance due to blindness and and physical limitations. Mother is unable to to care for him at home due to her own physical limitations but is quite involved in his care.    Has lived at [facility] [phone]), [name], for past 10 years per mother in room.  Mother lives nearby and is frequent visitor.  Patient seems to like group home, fond of his caregivers [name] and [name]).  Per mother, patient is seen by [facility], primarily by [name], NP. NP comes to facility and  follow patient regularly.   Assessment/plan status:  Psychosocial Support/Ongoing Assessment of Needs Other assessment/ plan:   Information/referral to community resources:   None needed at this time, RN CM will refer for home health needs as ordered.    PATIENT'S/FAMILY'S RESPONSE TO PLAN OF CARE: Mother and patient  both express satisfaction w care received at group home.        Santa Genera, LCSW Clinical Social Worker [phone])

## 2013-09-16 ENCOUNTER — Encounter (HOSPITAL_COMMUNITY): Payer: Self-pay | Admitting: General Surgery

## 2017-11-21 ENCOUNTER — Emergency Department (HOSPITAL_COMMUNITY): Payer: Medicare Other

## 2017-11-21 ENCOUNTER — Encounter (HOSPITAL_COMMUNITY): Payer: Self-pay | Admitting: Emergency Medicine

## 2017-11-21 ENCOUNTER — Emergency Department (HOSPITAL_COMMUNITY)
Admission: EM | Admit: 2017-11-21 | Discharge: 2017-11-21 | Disposition: A | Discharge status: Home / Self Care | Payer: Medicare Other | Attending: Emergency Medicine | Admitting: Emergency Medicine

## 2017-11-21 ENCOUNTER — Other Ambulatory Visit: Payer: Self-pay

## 2017-11-21 DIAGNOSIS — I10 Essential (primary) hypertension: Secondary | ICD-10-CM | POA: Diagnosis not present

## 2017-11-21 DIAGNOSIS — F70 Mild intellectual disabilities: Secondary | ICD-10-CM | POA: Insufficient documentation

## 2017-11-21 DIAGNOSIS — M542 Cervicalgia: Secondary | ICD-10-CM | POA: Diagnosis not present

## 2017-11-21 DIAGNOSIS — S0083XA Contusion of other part of head, initial encounter: Secondary | ICD-10-CM | POA: Diagnosis not present

## 2017-11-21 DIAGNOSIS — Z7982 Long term (current) use of aspirin: Secondary | ICD-10-CM | POA: Insufficient documentation

## 2017-11-21 DIAGNOSIS — W01190A Fall on same level from slipping, tripping and stumbling with subsequent striking against furniture, initial encounter: Secondary | ICD-10-CM | POA: Insufficient documentation

## 2017-11-21 DIAGNOSIS — S0990XA Unspecified injury of head, initial encounter: Secondary | ICD-10-CM

## 2017-11-21 DIAGNOSIS — Y939 Activity, unspecified: Secondary | ICD-10-CM | POA: Diagnosis not present

## 2017-11-21 DIAGNOSIS — Z982 Presence of cerebrospinal fluid drainage device: Secondary | ICD-10-CM | POA: Diagnosis not present

## 2017-11-21 DIAGNOSIS — Y999 Unspecified external cause status: Secondary | ICD-10-CM | POA: Insufficient documentation

## 2017-11-21 DIAGNOSIS — Y92003 Bedroom of unspecified non-institutional (private) residence as the place of occurrence of the external cause: Secondary | ICD-10-CM | POA: Diagnosis not present

## 2017-11-21 DIAGNOSIS — Z79899 Other long term (current) drug therapy: Secondary | ICD-10-CM | POA: Diagnosis not present

## 2017-11-21 DIAGNOSIS — W19XXXA Unspecified fall, initial encounter: Secondary | ICD-10-CM

## 2017-11-21 NOTE — Discharge Instructions (Signed)
X-ray of your head and neck showed no acute findings.  There was a small 8 mm lesion noted on cervical 5 vertebrae.  The radiologist has recommended a bone scan for follow-up.  This can be done as an elective procedure.  Follow-up with your primary care doctor.  Ice pack to swollen area.

## 2017-11-21 NOTE — ED Provider Notes (Signed)
[facility] EMERGENCY DEPARTMENT Provider Note   CSN: [id number] Arrival date & time: 11/21/17  1038     History   Chief Complaint No chief complaint on file.   HPI [name] is a 57 y.o. male.  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  Patient had an accidental trip and fall yesterday striking the right side of his head.  Mother reports no loss of consciousness or new neurological deficits. He has a  long-standing VP shunt.  Mother states his behavior is normal.      Past Medical History:  Diagnosis Date  . Blind   . GERD (gastroesophageal reflux disease)   . Hyperlipidemia   . Hypertension   . Hypoxic brain injury ([place])   . Muscle spasms of head and/or neck    and overall body  . Obesity     Patient Active Problem List   Diagnosis Date Noted  . Mass of shoulder region 09/12/2013  . Dysphagia 12/20/2012  . GERD (gastroesophageal reflux disease) 12/20/2012    Past Surgical History:  Procedure Laterality Date  . CHOLECYSTECTOMY    . ESOPHAGOGASTRODUODENOSCOPY N/A 01/04/2013   ERX:VQMGQ hiatal hernia/GASTRIC PolypS/MODERATE Non-erosive gastritis/SMALL nodule in the duodenal bulb  . high pressure shunt    . MALONEY DILATION N/A 01/04/2013   Procedure: MALONEY DILATION;  Surgeon: Danie Binder, MD;  Location: [facility] ENDO SUITE;  Service: Endoscopy;  Laterality: N/A;  . MASS EXCISION Left 09/12/2013   Procedure: EXCISION NEOPLASM LEFT SHOULDER;  Surgeon: Scherry Ran, MD;  Location: [facility] ORS;  Service: General;  Laterality: Left;  . SAVORY DILATION N/A 01/04/2013   Procedure: SAVORY DILATION;  Surgeon: Danie Binder, MD;  Location: [facility] ENDO SUITE;  Service: Endoscopy;  Laterality: N/A;       Home Medications    Prior to Admission medications   Medication Sig Start Date End Date Taking? Authorizing Provider  acetaminophen-codeine (TYLENOL #3) 300-30 MG per tablet Take 1 tablet by mouth every 6 (six) hours as needed for moderate  pain.    [provider]  aspirin 81 MG tablet Take 81 mg by mouth daily.    [provider]  atorvastatin (LIPITOR) 40 MG tablet Take 40 mg by mouth at bedtime.  12/05/12   [provider]  calcium gluconate 500 MG tablet Take 2 tablets by mouth 3 (three) times daily.    [provider]  clonazePAM (KLONOPIN) 0.5 MG tablet Take 0.5 mg by mouth 2 (two) times daily.  11/23/12   [provider]  cyclobenzaprine (FLEXERIL) 10 MG tablet Take 10 mg by mouth 3 (three) times daily as needed for muscle spasms.    [provider]  esomeprazole (NEXIUM) 40 MG capsule Take 40 mg by mouth daily at 12 noon.    [provider]  fenofibrate (TRICOR) 145 MG tablet Take 145 mg by mouth daily.  11/26/12   [provider]  hydrochlorothiazide (HYDRODIURIL) 25 MG tablet Take 25 mg by mouth daily.  11/26/12   [provider]  meclizine (ANTIVERT) 25 MG tablet Take 25 mg by mouth 2 (two) times daily.     [provider]  Melatonin 3 MG CAPS Take 1 capsule by mouth daily.    [provider]  Multiple Vitamin (MULTIVITAMIN) capsule Take 1 capsule by mouth daily.    [provider]  potassium chloride (K-DUR) 10 MEQ tablet Take 10 mEq by mouth daily.  12/05/12   [provider]    Family History Family History  Problem Relation Age of Onset  . Breast cancer Mother   . Kidney failure Father   . Breast cancer Sister     Social History Social History   Tobacco Use  . Smoking status: Never Smoker  . Smokeless tobacco: Never Used  Substance Use Topics  . Alcohol use: No  . Drug use: No     Allergies   Penicillins   Review of Systems Review of Systems  Unable to perform ROS: Other (mental retardation)     Physical Exam Updated Vital Signs BP (!) 152/108 (BP Location: Right Arm)   Pulse 65   Temp 97.9 F (36.6 C) (Oral)   Resp 18   SpO2 99%   Physical Exam  Constitutional: He appears  well-developed and well-nourished.  pleasant, no acute distress  HENT:  Head: Normocephalic.  Right temporal hematoma approximately 5-6 cm in diameter  Eyes:  blind  Neck: Neck supple.  Cardiovascular: Normal rate and regular rhythm.  Pulmonary/Chest: Effort normal and breath sounds normal.  Abdominal: Soft. Bowel sounds are normal.  Musculoskeletal: Normal range of motion.  Neurological: He is alert.  Mother reports normal behavior.  Skin: Skin is warm and dry.  Psychiatric: He has a normal mood and affect. His behavior is normal.  Nursing note and vitals reviewed.    ED Treatments / Results  Labs (all labs ordered are listed, but only abnormal results are displayed) Labs Reviewed - No data to display  EKG  EKG Interpretation None       Radiology Ct Head Wo Contrast  Result Date: 11/21/2017 CLINICAL DATA:  Headache after fall yesterday. EXAM: CT HEAD WITHOUT CONTRAST CT CERVICAL SPINE WITHOUT CONTRAST TECHNIQUE: Multidetector CT imaging of the head and cervical spine was performed following the standard protocol without intravenous contrast. Multiplanar CT image reconstructions of the cervical spine were also generated. COMPARISON:  CT head dated October 08, 2007. FINDINGS: CT HEAD FINDINGS Brain: No evidence of acute infarction, hemorrhage, hydrocephalus, extra-axial collection or mass lesion/mass effect. Unchanged right frontal ventricular shunt catheter. Mild dilatation of the ventricles is similar to prior study. No evidence of transependymal flow. The bifrontal diameter measures 4.6 cm, the same as on prior study. Stable moderate cerebral atrophy. Vascular: Atherosclerotic vascular calcification of the carotid siphons. No hyperdense vessel. Skull: Negative for fracture or focal lesion. Sinuses/Orbits: No acute finding. Other: None. CT CERVICAL SPINE FINDINGS Alignment: Reversal of the normal cervical lordosis, centered at C6. No traumatic malalignment. Skull base and  vertebrae: No acute fracture. There is an 8 mm well-defined sclerotic lesion within the right aspect of the C5 vertebral body. Soft tissues and spinal canal: No prevertebral fluid or swelling. No visible canal hematoma. Disc levels: Mild multilevel degenerative disc disease and uncovertebral hypertrophy throughout the cervical spine. Upper chest: Negative. Other: Large mucous retention cyst in the left maxillary sinus, unchanged. IMPRESSION: 1. No acute intracranial abnormality. Stable dilatation of the ventricles without evidence of acute hydrocephalus. 2. No acute cervical spine fracture. Mild multilevel degenerative changes throughout the cervical spine. 3. 8 mm well-defined sclerotic lesion within the C5 vertebral body, favored to represent a bone island in the absence of known malignancy. Consider further evaluation with bone scan as clinically indicated. Electronically Signed   By: Titus Dubin M.D.   On: 11/21/2017 12:57   Ct Cervical Spine Wo Contrast  Result Date: 11/21/2017 CLINICAL DATA:  Headache after fall yesterday. EXAM: CT HEAD WITHOUT CONTRAST CT CERVICAL SPINE  WITHOUT CONTRAST TECHNIQUE: Multidetector CT imaging of the head and cervical spine was performed following the standard protocol without intravenous contrast. Multiplanar CT image reconstructions of the cervical spine were also generated. COMPARISON:  CT head dated October 08, 2007. FINDINGS: CT HEAD FINDINGS Brain: No evidence of acute infarction, hemorrhage, hydrocephalus, extra-axial collection or mass lesion/mass effect. Unchanged right frontal ventricular shunt catheter. Mild dilatation of the ventricles is similar to prior study. No evidence of transependymal flow. The bifrontal diameter measures 4.6 cm, the same as on prior study. Stable moderate cerebral atrophy. Vascular: Atherosclerotic vascular calcification of the carotid siphons. No hyperdense vessel. Skull: Negative for fracture or focal lesion. Sinuses/Orbits: No acute  finding. Other: None. CT CERVICAL SPINE FINDINGS Alignment: Reversal of the normal cervical lordosis, centered at C6. No traumatic malalignment. Skull base and vertebrae: No acute fracture. There is an 8 mm well-defined sclerotic lesion within the right aspect of the C5 vertebral body. Soft tissues and spinal canal: No prevertebral fluid or swelling. No visible canal hematoma. Disc levels: Mild multilevel degenerative disc disease and uncovertebral hypertrophy throughout the cervical spine. Upper chest: Negative. Other: Large mucous retention cyst in the left maxillary sinus, unchanged. IMPRESSION: 1. No acute intracranial abnormality. Stable dilatation of the ventricles without evidence of acute hydrocephalus. 2. No acute cervical spine fracture. Mild multilevel degenerative changes throughout the cervical spine. 3. 8 mm well-defined sclerotic lesion within the C5 vertebral body, favored to represent a bone island in the absence of known malignancy. Consider further evaluation with bone scan as clinically indicated. Electronically Signed   By: Titus Dubin M.D.   On: 11/21/2017 12:57    Procedures Procedures (including critical care time)  Medications Ordered in ED Medications - No data to display   Initial Impression / Assessment and Plan / ED Course  I have reviewed the triage vital signs and the nursing notes.  Pertinent labs & imaging results that were available during my care of the patient were reviewed by me and considered in my medical decision making (see chart for details).     Status post accidental mechanical fall yesterday resulting in trauma to the right temple.  Patient's behavior is normal.  CT scan of brain and cervical spine reveal no acute pathology.  There is a 8 mm sclerotic lesion in cervical 5 vertebrae.  This was discussed with the mother and patient.  She understands the need for bone scan follow-up.  Final Clinical Impressions(s) / ED Diagnoses   Final diagnoses:    Fall, initial encounter  Injury of head, initial encounter  Neck pain    ED Discharge Orders    None       Nat Christen, MD 11/21/17 [phone]

## 2017-11-21 NOTE — ED Notes (Signed)
Pt with c/o pain to side of shunt, states that when he fell yesterday, he had hit head on night stand in bedroom, denies LOC.  Pt states that it feels as if liquid is running down on same side of shunt.

## 2017-11-21 NOTE — ED Triage Notes (Signed)
Fell and hit head around shunt, felt pop yesterday

## 2017-12-27 ENCOUNTER — Other Ambulatory Visit (HOSPITAL_COMMUNITY): Payer: Self-pay | Admitting: Family Medicine

## 2017-12-27 DIAGNOSIS — N889 Noninflammatory disorder of cervix uteri, unspecified: Secondary | ICD-10-CM

## 2018-01-03 ENCOUNTER — Encounter (HOSPITAL_COMMUNITY): Payer: Medicare Other

## 2018-01-03 ENCOUNTER — Ambulatory Visit (HOSPITAL_COMMUNITY): Payer: Medicare Other

## 2018-01-11 ENCOUNTER — Encounter (HOSPITAL_COMMUNITY): Payer: Self-pay

## 2018-01-11 ENCOUNTER — Ambulatory Visit (HOSPITAL_COMMUNITY): Payer: Medicaid Other

## 2018-01-11 ENCOUNTER — Other Ambulatory Visit (HOSPITAL_COMMUNITY): Payer: Medicare Other

## 2018-01-12 ENCOUNTER — Encounter (HOSPITAL_COMMUNITY)
Admission: RE | Admit: 2018-01-12 | Discharge: 2018-01-12 | Disposition: A | Discharge status: Home / Self Care | Payer: Medicare Other | Source: Ambulatory Visit | Attending: Family Medicine | Admitting: Family Medicine

## 2018-01-12 DIAGNOSIS — M489 Spondylopathy, unspecified: Secondary | ICD-10-CM | POA: Diagnosis present

## 2018-01-12 DIAGNOSIS — N889 Noninflammatory disorder of cervix uteri, unspecified: Secondary | ICD-10-CM

## 2018-01-12 MED ORDER — TECHNETIUM TC 99M MEDRONATE IV KIT
25.0000 | PACK | Freq: Once | INTRAVENOUS | Status: AC | PRN
  Administered 2018-01-12: 25 via INTRAVENOUS

## 2019-03-24 IMAGING — CT CT CERVICAL SPINE W/O CM
5 of 7 series · 14 of 33 positions shown, 15 images · non-contrast
Comparison: CT head dated October 08, 2007.

CLINICAL DATA: Headache after fall yesterday.

EXAM:
CT HEAD WITHOUT CONTRAST
CT CERVICAL SPINE WITHOUT CONTRAST
TECHNIQUE: Multidetector CT imaging of the head and cervical spine was
performed following the standard protocol without intravenous
contrast. Multiplanar CT image reconstructions of the cervical spine
were also generated.

[Series 4: head bone · axial · 0.41mm/px · z∈[+62,+114]mm · 2 of 78 slices shown]
[im 26/78  bone]
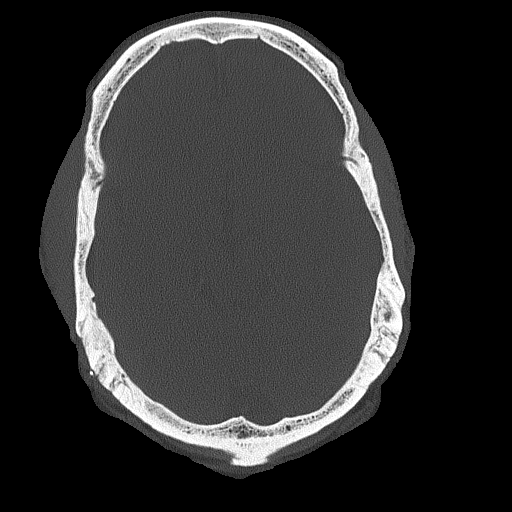
[im 52/78  bone]
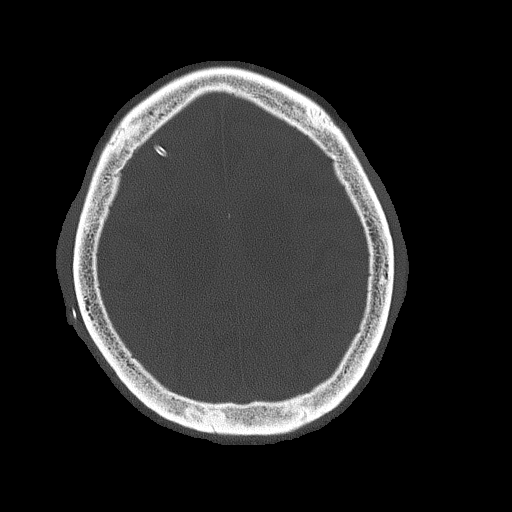

[Series 5: coronal soft tissue · coronal · 0.33mm/px · 3 of 69 slices shown]
[im 14/69  bone]
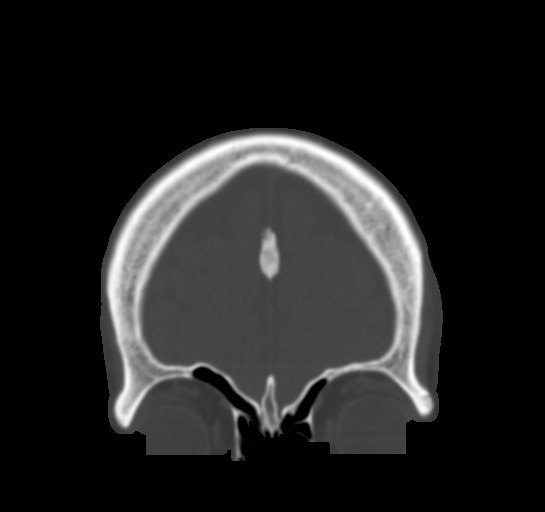
[im 28/69  bone]
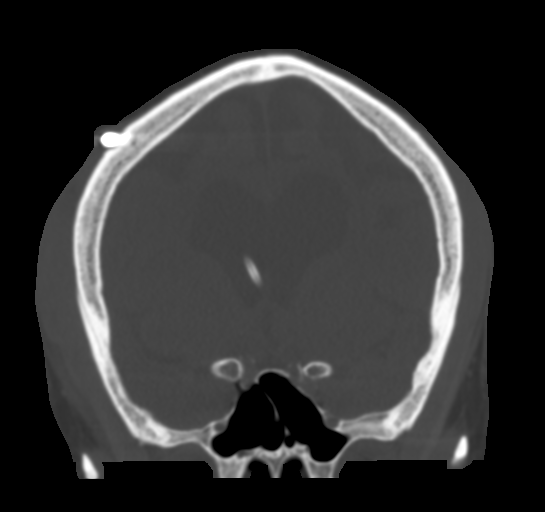
[im 41/69  bone]
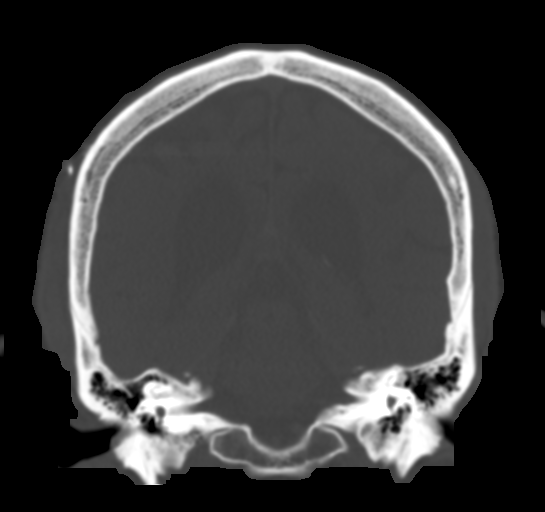

[Series 8: c spine soft · axial · 0.37mm/px · z∈[-72,-18]mm · 2 of 81 slices shown]
[im 27/81  soft-tissue]
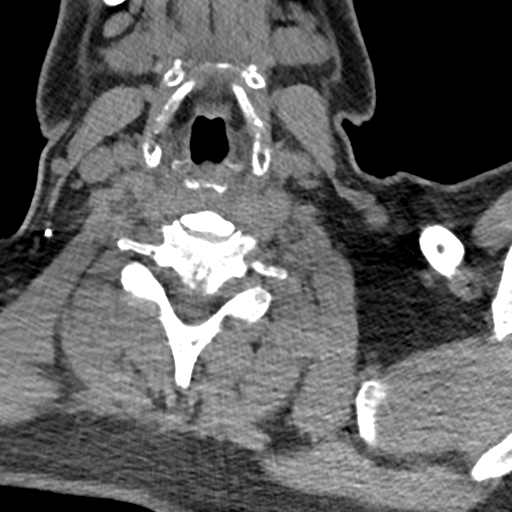
[im 54/81  soft-tissue]
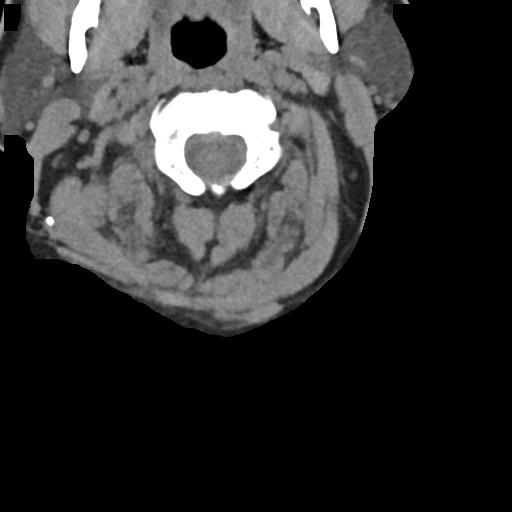

[Series 9: sagittal bone · sagittal · 0.37mm/px · 5 of 78 slices shown]
[im 13/78  bone]
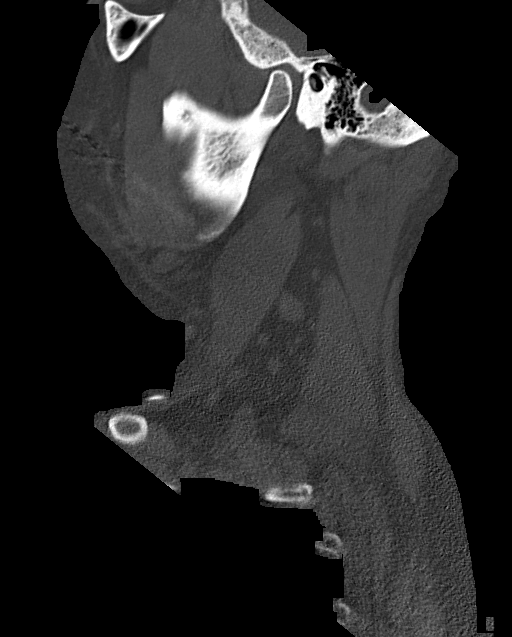
[im 26/78  bone]
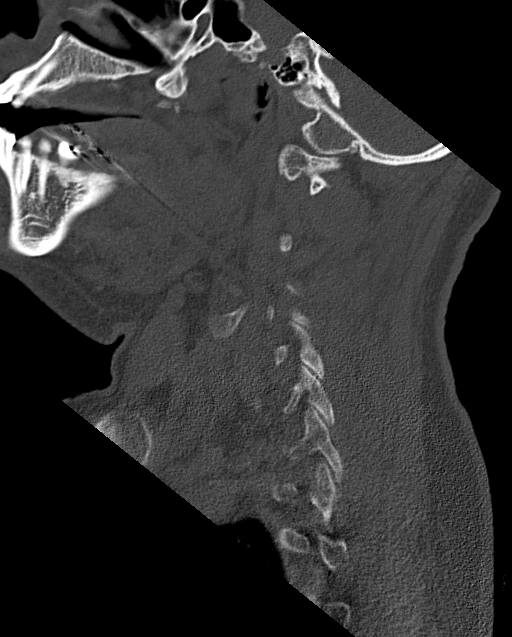
[im 39/78  bone]
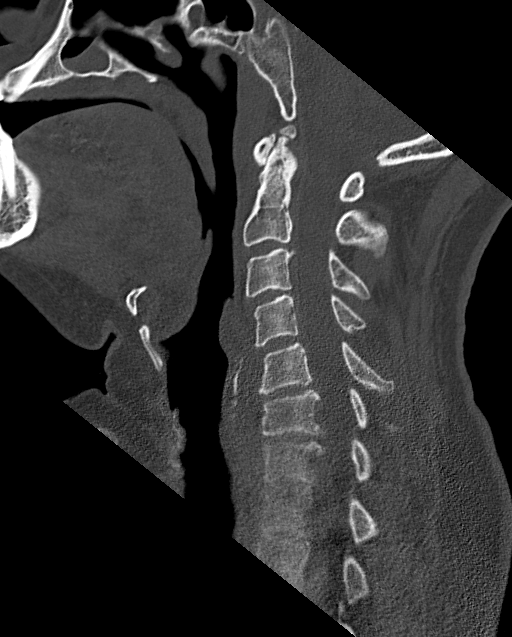
[im 52/78  bone]
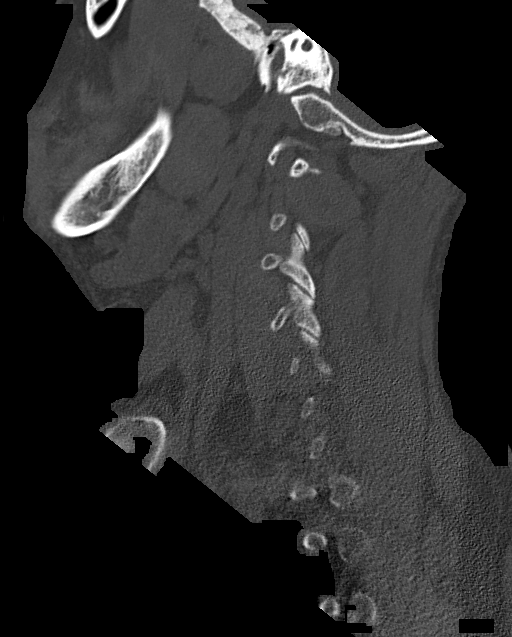
[im 65/78  bone]
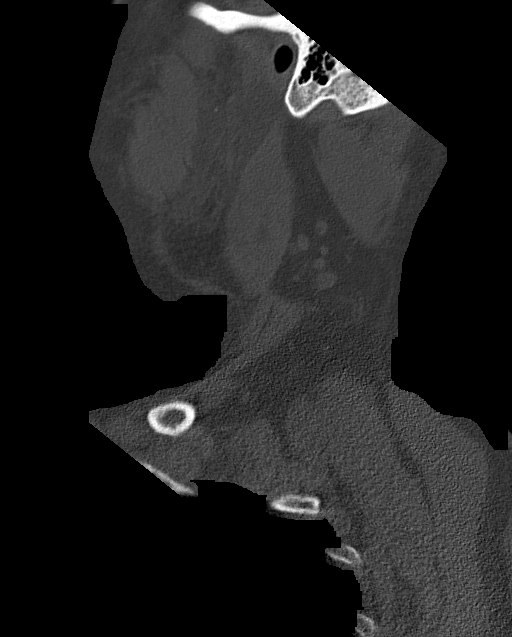

[Series 12: orthogonal bone · oblique · 0.21mm/px · 2 of 90 slices shown, 3 images]
[im 30/90  soft-tissue]
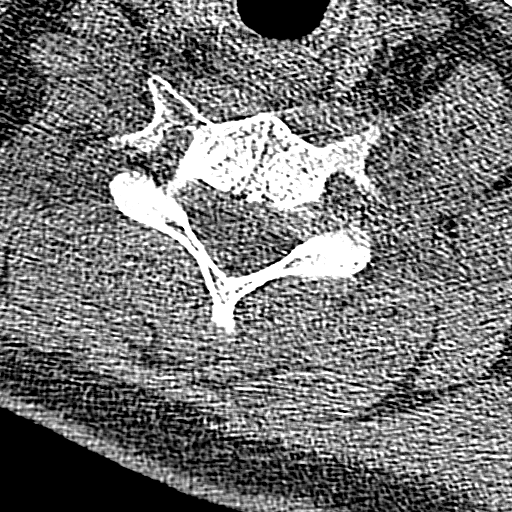
[im 30/90  bone]
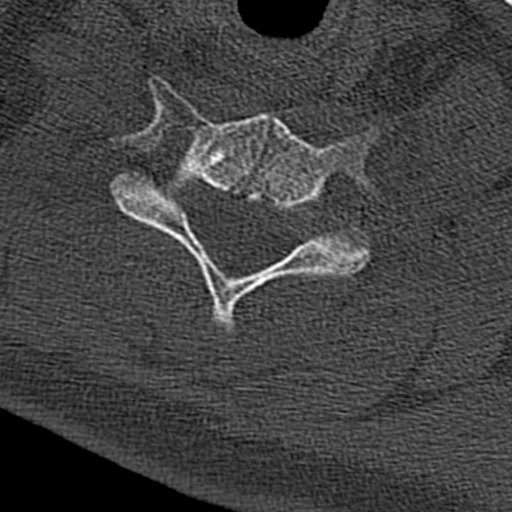
[im 60/90  bone]
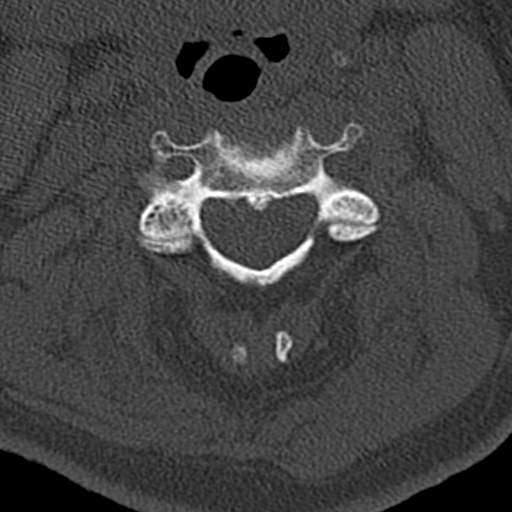

[14 of 33 positions shown; findings below may reference images not displayed]

FINDINGS: CT HEAD FINDINGS

Brain: No evidence of acute infarction, hemorrhage, hydrocephalus,
extra-axial collection or mass lesion/mass effect. Unchanged right
frontal ventricular shunt catheter. Mild dilatation of the
ventricles is similar to prior study. No evidence of transependymal
flow. The bifrontal diameter measures 4.6 cm, the same as on prior
study. Stable moderate cerebral atrophy.

Vascular: Atherosclerotic vascular calcification of the carotid
siphons. No hyperdense vessel.

Skull: Negative for fracture or focal lesion.

Sinuses/Orbits: No acute finding.

Other: None.

CT CERVICAL SPINE FINDINGS

Alignment: Reversal of the normal cervical lordosis, centered at C6.
No traumatic malalignment.

Skull base and vertebrae: No acute fracture. There is an 8 mm
well-defined sclerotic lesion within the right aspect of the C5
vertebral body.

Soft tissues and spinal canal: No prevertebral fluid or swelling. No
visible canal hematoma.

Disc levels: Mild multilevel degenerative disc disease and
uncovertebral hypertrophy throughout the cervical spine.

Upper chest: Negative.

Other: Large mucous retention cyst in the left maxillary sinus,
unchanged.
IMPRESSION: 1. No acute intracranial abnormality. Stable dilatation of the
ventricles without evidence of acute hydrocephalus.
2. No acute cervical spine fracture. Mild multilevel degenerative
changes throughout the cervical spine.
3. 8 mm well-defined sclerotic lesion within the C5 vertebral body,
favored to represent a bone island in the absence of known
malignancy. Consider further evaluation with bone scan as clinically
indicated.

## 2020-12-13 ENCOUNTER — Other Ambulatory Visit: Payer: Self-pay

## 2020-12-13 ENCOUNTER — Emergency Department (HOSPITAL_COMMUNITY): Payer: Medicare Other

## 2020-12-13 ENCOUNTER — Encounter (HOSPITAL_COMMUNITY): Payer: Self-pay

## 2020-12-13 ENCOUNTER — Inpatient Hospital Stay (HOSPITAL_COMMUNITY)
Admission: EM | Admit: 2020-12-13 | Discharge: 2020-12-15 | DRG: 660 | Disposition: A | Discharge status: Home / Self Care | Payer: Medicare Other | Attending: Internal Medicine | Admitting: Internal Medicine

## 2020-12-13 DIAGNOSIS — H548 Legal blindness, as defined in USA: Secondary | ICD-10-CM | POA: Diagnosis present

## 2020-12-13 DIAGNOSIS — Z880 Allergy status to penicillin: Secondary | ICD-10-CM

## 2020-12-13 DIAGNOSIS — E785 Hyperlipidemia, unspecified: Secondary | ICD-10-CM | POA: Diagnosis present

## 2020-12-13 DIAGNOSIS — Z7982 Long term (current) use of aspirin: Secondary | ICD-10-CM

## 2020-12-13 DIAGNOSIS — N136 Pyonephrosis: Principal | ICD-10-CM | POA: Diagnosis present

## 2020-12-13 DIAGNOSIS — R300 Dysuria: Secondary | ICD-10-CM | POA: Diagnosis not present

## 2020-12-13 DIAGNOSIS — E669 Obesity, unspecified: Secondary | ICD-10-CM | POA: Diagnosis present

## 2020-12-13 DIAGNOSIS — N201 Calculus of ureter: Secondary | ICD-10-CM

## 2020-12-13 DIAGNOSIS — N281 Cyst of kidney, acquired: Secondary | ICD-10-CM

## 2020-12-13 DIAGNOSIS — I10 Essential (primary) hypertension: Secondary | ICD-10-CM | POA: Diagnosis present

## 2020-12-13 DIAGNOSIS — Z6838 Body mass index (BMI) 38.0-38.9, adult: Secondary | ICD-10-CM

## 2020-12-13 DIAGNOSIS — N400 Enlarged prostate without lower urinary tract symptoms: Secondary | ICD-10-CM | POA: Diagnosis present

## 2020-12-13 DIAGNOSIS — T502X5A Adverse effect of carbonic-anhydrase inhibitors, benzothiadiazides and other diuretics, initial encounter: Secondary | ICD-10-CM | POA: Diagnosis present

## 2020-12-13 DIAGNOSIS — E876 Hypokalemia: Secondary | ICD-10-CM | POA: Diagnosis present

## 2020-12-13 DIAGNOSIS — Z79899 Other long term (current) drug therapy: Secondary | ICD-10-CM

## 2020-12-13 DIAGNOSIS — G931 Anoxic brain damage, not elsewhere classified: Secondary | ICD-10-CM | POA: Diagnosis present

## 2020-12-13 DIAGNOSIS — Z9049 Acquired absence of other specified parts of digestive tract: Secondary | ICD-10-CM

## 2020-12-13 DIAGNOSIS — N12 Tubulo-interstitial nephritis, not specified as acute or chronic: Secondary | ICD-10-CM

## 2020-12-13 DIAGNOSIS — B372 Candidiasis of skin and nail: Secondary | ICD-10-CM

## 2020-12-13 DIAGNOSIS — N202 Calculus of kidney with calculus of ureter: Secondary | ICD-10-CM | POA: Diagnosis present

## 2020-12-13 DIAGNOSIS — Z20822 Contact with and (suspected) exposure to covid-19: Secondary | ICD-10-CM | POA: Diagnosis present

## 2020-12-13 DIAGNOSIS — K219 Gastro-esophageal reflux disease without esophagitis: Secondary | ICD-10-CM | POA: Diagnosis present

## 2020-12-13 DIAGNOSIS — N135 Crossing vessel and stricture of ureter without hydronephrosis: Secondary | ICD-10-CM | POA: Diagnosis present

## 2020-12-13 LAB — CBC WITH DIFFERENTIAL/PLATELET
Abs Immature Granulocytes: 0.03 10*3/uL (ref 0.00–0.07)
Basophils Absolute: 0.1 10*3/uL (ref 0.0–0.1)
Basophils Relative: 1 %
Eosinophils Absolute: 0.1 10*3/uL (ref 0.0–0.5)
Eosinophils Relative: 1 %
HCT: 40.8 % (ref 39.0–52.0)
Hemoglobin: 13.5 g/dL (ref 13.0–17.0)
Immature Granulocytes: 0 %
Lymphocytes Relative: 21 %
Lymphs Abs: 2.8 10*3/uL (ref 0.7–4.0)
MCH: 28 pg (ref 26.0–34.0)
MCHC: 33.1 g/dL (ref 30.0–36.0)
MCV: 84.6 fL (ref 80.0–100.0)
Monocytes Absolute: 1 10*3/uL (ref 0.1–1.0)
Monocytes Relative: 7 %
Neutro Abs: 9.1 10*3/uL — ABNORMAL HIGH (ref 1.7–7.7)
Neutrophils Relative %: 70 %
Platelets: 298 10*3/uL (ref 150–400)
RBC: 4.82 MIL/uL (ref 4.22–5.81)
RDW: 15.4 % (ref 11.5–15.5)
WBC: 13 10*3/uL — ABNORMAL HIGH (ref 4.0–10.5)
nRBC: 0 % (ref 0.0–0.2)

## 2020-12-13 LAB — BASIC METABOLIC PANEL
Anion gap: 11 (ref 5–15)
BUN: 20 mg/dL (ref 6–20)
CO2: 27 mmol/L (ref 22–32)
Calcium: 9.2 mg/dL (ref 8.9–10.3)
Chloride: 99 mmol/L (ref 98–111)
Creatinine, Ser: 0.75 mg/dL (ref 0.61–1.24)
GFR, Estimated: >60 mL/min (ref >60)
Glucose, Bld: 156 mg/dL — ABNORMAL HIGH (ref 70–99)
Potassium: 3 mmol/L — ABNORMAL LOW (ref 3.5–5.1)
Sodium: 137 mmol/L (ref 135–145)

## 2020-12-13 LAB — URINALYSIS, ROUTINE W REFLEX MICROSCOPIC
Bilirubin Urine: NEGATIVE
Glucose, UA: NEGATIVE mg/dL
Ketones, ur: NEGATIVE mg/dL
Leukocytes,Ua: NEGATIVE
Nitrite: NEGATIVE
Protein, ur: NEGATIVE mg/dL
RBC / HPF: >50 RBC/hpf — ABNORMAL HIGH (ref 0–5)
Specific Gravity, Urine: 1.012 (ref 1.005–1.030)
pH: 7 (ref 5.0–8.0)

## 2020-12-13 LAB — CULTURE, BLOOD (ROUTINE X 2)

## 2020-12-13 LAB — LACTIC ACID, PLASMA: Lactic Acid, Venous: 1.4 mmol/L (ref 0.5–1.9)

## 2020-12-13 MED ORDER — SODIUM CHLORIDE 0.9 % IV BOLUS
500.0000 mL | Freq: Once | INTRAVENOUS | Status: AC
  Administered 2020-12-13: 500 mL via INTRAVENOUS

## 2020-12-13 MED ORDER — NYSTATIN 100000 UNIT/GM EX POWD
Freq: Once | CUTANEOUS | Status: AC
  Administered 2020-12-13: 10 g via TOPICAL
  Filled 2020-12-13: qty 15

## 2020-12-13 NOTE — ED Triage Notes (Signed)
Pt lives at a group home in [place]. Recent onset increased urinary frequency, foul odor to urine, urinary thickness. Temp 100.2 F per EMS. Suspected UTI by care facility.

## 2020-12-13 NOTE — ED Notes (Signed)
Entered room and introduced self to patient. Pt appears to be resting in bed, respirations are even and unlabored with equal chest rise and fall. Bed is locked in the lowest position, side rails x2, call bell within reach. Pt educated on call light use and hourly rounding, verbalized understanding and in agreement at this time. All questions and concerns voiced addressed. Refreshments offered and provided per patient request. Vital signs cycling q30 minutes. Will continue to monitor.  

## 2020-12-13 NOTE — ED Provider Notes (Signed)
[facility] EMERGENCY DEPARTMENT Provider Note   CSN: [id number] Arrival date & time: 12/13/20  2021     History Chief Complaint  Patient presents with  . Urinary Frequency    Urine smells foul, urine is thick.    [name] is a 60 y.o. male with a history of hypertension, hyperlipidemia hypoxic brain injury presenting from a local group home with a 24-hour history of increased urinary frequency, dysuria and foul smelling urine along with temperature of 100.2 per EMS.  Mother at the bedside also reports a rash in his skin folds of his abdomen which she noticed this evening upon arrival here.  He denies nausea or vomiting, denies abdominal pain has maintained a good appetite.  The history is provided by a parent and the patient. The history is limited by a developmental delay.       Past Medical History:  Diagnosis Date  . Blind   . GERD (gastroesophageal reflux disease)   . Hyperlipidemia   . Hypertension   . Hypoxic brain injury ([place])   . Muscle spasms of head and/or neck    and overall body  . Obesity     Patient Active Problem List   Diagnosis Date Noted  . Mass of shoulder region 09/12/2013  . Dysphagia 12/20/2012  . GERD (gastroesophageal reflux disease) 12/20/2012    Past Surgical History:  Procedure Laterality Date  . CHOLECYSTECTOMY    . ESOPHAGOGASTRODUODENOSCOPY N/A 01/04/2013   OEU:MPNTI hiatal hernia/GASTRIC PolypS/MODERATE Non-erosive gastritis/SMALL nodule in the duodenal bulb  . high pressure shunt    . MALONEY DILATION N/A 01/04/2013   Procedure: MALONEY DILATION;  Surgeon: Danie Binder, MD;  Location: [facility] SUITE;  Service: Endoscopy;  Laterality: N/A;  . MASS EXCISION Left 09/12/2013   Procedure: EXCISION NEOPLASM LEFT SHOULDER;  Surgeon: Scherry Ran, MD;  Location: [facility] ORS;  Service: General;  Laterality: Left;  . SAVORY DILATION N/A 01/04/2013   Procedure: SAVORY DILATION;  Surgeon: Danie Binder, MD;  Location: [facility]ITE;  Service:  Endoscopy;  Laterality: N/A;       Family History  Problem Relation Age of Onset  . Breast cancer Mother   . Kidney failure Father   . Breast cancer Sister     Social History   Tobacco Use  . Smoking status: Never Smoker  . Smokeless tobacco: Never Used  Substance Use Topics  . Alcohol use: No  . Drug use: No    Home Medications Prior to Admission medications   Medication Sig Start Date End Date Taking? Authorizing Provider  acetaminophen (TYLENOL) 500 MG tablet Take 500 mg by mouth every 6 (six) hours as needed for mild pain or moderate pain.    [provider]  aspirin 81 MG tablet Take 81 mg by mouth daily.    [provider]  atorvastatin (LIPITOR) 40 MG tablet Take 40 mg by mouth at bedtime.  12/05/12   [provider]  esomeprazole (NEXIUM) 40 MG capsule Take 40 mg by mouth daily at 12 noon.    [provider]  fenofibrate (TRICOR) 145 MG tablet Take 145 mg by mouth daily.  11/26/12   [provider]  hydrochlorothiazide (HYDRODIURIL) 25 MG tablet Take 50 mg by mouth daily.  11/26/12   [provider]  meclizine (ANTIVERT) 25 MG tablet Take 25 mg by mouth daily as needed for dizziness.     [provider]  Multiple Vitamin (MULTIVITAMIN) capsule Take 1 capsule by  mouth daily.    [provider]  potassium chloride (K-DUR) 10 MEQ tablet Take 10 mEq by mouth daily.  12/05/12   [provider]  Vitamin D, Ergocalciferol, (DRISDOL) 50000 units CAPS capsule Take 50,000 Units by mouth every 7 (seven) days.    [provider]    Allergies    Penicillins  Review of Systems   Review of Systems  Constitutional: Positive for fever. Negative for chills.  HENT: Negative for congestion and sore throat.   Eyes: Negative.   Respiratory: Negative for chest tightness.   Gastrointestinal: Negative for nausea and vomiting.  Genitourinary: Positive for dysuria.  Musculoskeletal: Negative for  arthralgias, joint swelling and neck pain.  Skin: Negative.  Negative for rash and wound.  Neurological: Negative for dizziness, weakness, light-headedness and numbness.  Psychiatric/Behavioral: Negative.   All other systems reviewed and are negative.   Physical Exam Updated Vital Signs BP 136/80 (BP Location: Left Arm)   Pulse 64   Temp 98.5 F (36.9 C) (Oral)   Resp 20   SpO2 98%   Physical Exam Vitals and nursing note reviewed.  Constitutional:      Appearance: He is well-developed.  HENT:     Head: Normocephalic and atraumatic.  Eyes:     Conjunctiva/sclera: Conjunctivae normal.  Cardiovascular:     Rate and Rhythm: Normal rate and regular rhythm.     Heart sounds: Normal heart sounds.  Pulmonary:     Effort: Pulmonary effort is normal.     Breath sounds: Normal breath sounds. No wheezing.  Abdominal:     General: Bowel sounds are normal. There is no distension.     Palpations: Abdomen is soft.     Tenderness: There is no abdominal tenderness. There is no guarding.  Musculoskeletal:        General: Normal range of motion.     Cervical back: Normal range of motion.  Skin:    General: Skin is warm and dry.     Findings: Erythema and rash present.     Comments: Erythematous rash with satellite lesions intertrigious abdominal wall.  Wet appearing.  Neurological:     Mental Status: He is alert. Mental status is at baseline.     ED Results / Procedures / Treatments   Labs (all labs ordered are listed, but only abnormal results are displayed) Labs Reviewed  CBC WITH DIFFERENTIAL/PLATELET - Abnormal; Notable for the following components:      Result Value   WBC 13.0 (*)    Neutro Abs 9.1 (*)    All other components within normal limits  BASIC METABOLIC PANEL - Abnormal; Notable for the following components:   Potassium 3.0 (*)    Glucose, Bld 156 (*)    All other components within normal limits  URINALYSIS, ROUTINE W REFLEX MICROSCOPIC - Abnormal; Notable for  the following components:   APPearance HAZY (*)    Hgb urine dipstick MODERATE (*)    RBC / HPF >50 (*)    Bacteria, UA RARE (*)    All other components within normal limits  CULTURE, BLOOD (ROUTINE X 2)  CULTURE, BLOOD (ROUTINE X 2)  LACTIC ACID, PLASMA    EKG None  Radiology No results found.  Procedures Procedures   Medications Ordered in ED Medications  nystatin (MYCOSTATIN/NYSTOP) topical powder (has no administration in time range)  sodium chloride 0.9 % bolus 500 mL (500 mLs Intravenous New Bag/Given 12/13/20 2221)    ED Course  I have reviewed the  triage vital signs and the nursing notes.  Pertinent labs & imaging results that were available during my care of the patient were reviewed by me and considered in my medical decision making (see chart for details).    MDM Rules/Calculators/A&P                          Pt with low grade fever with dysuria, leukocytosis with low grade fever.  Pending lactate.  Blood cultures also collected.  Yeast dermatitis treated with nystatin powder.    Pending lab results.  Care assumed by Dr Christy Gentles. Final Clinical Impression(s) / ED Diagnoses Final diagnoses:  Dysuria  Candidal dermatitis    Rx / DC Orders ED Discharge Orders    None       Landis Martins 12/13/20 2331    Ripley Fraise, MD 12/14/20 [id number]

## 2020-12-14 ENCOUNTER — Emergency Department (HOSPITAL_COMMUNITY): Payer: Medicare Other | Admitting: Registered Nurse

## 2020-12-14 ENCOUNTER — Encounter (HOSPITAL_COMMUNITY)
Admission: EM | Disposition: A | Discharge status: Home / Self Care | Payer: Self-pay | Source: Home / Self Care | Attending: Internal Medicine

## 2020-12-14 ENCOUNTER — Observation Stay (HOSPITAL_COMMUNITY): Payer: Medicare Other

## 2020-12-14 ENCOUNTER — Emergency Department (HOSPITAL_COMMUNITY): Payer: Medicare Other

## 2020-12-14 ENCOUNTER — Encounter (HOSPITAL_COMMUNITY): Payer: Self-pay | Admitting: Internal Medicine

## 2020-12-14 DIAGNOSIS — B372 Candidiasis of skin and nail: Secondary | ICD-10-CM | POA: Diagnosis present

## 2020-12-14 DIAGNOSIS — T502X5A Adverse effect of carbonic-anhydrase inhibitors, benzothiadiazides and other diuretics, initial encounter: Secondary | ICD-10-CM | POA: Diagnosis not present

## 2020-12-14 DIAGNOSIS — Z6838 Body mass index (BMI) 38.0-38.9, adult: Secondary | ICD-10-CM | POA: Diagnosis not present

## 2020-12-14 DIAGNOSIS — N135 Crossing vessel and stricture of ureter without hydronephrosis: Secondary | ICD-10-CM | POA: Diagnosis not present

## 2020-12-14 DIAGNOSIS — K219 Gastro-esophageal reflux disease without esophagitis: Secondary | ICD-10-CM | POA: Diagnosis present

## 2020-12-14 DIAGNOSIS — I10 Essential (primary) hypertension: Secondary | ICD-10-CM | POA: Diagnosis present

## 2020-12-14 DIAGNOSIS — Z79899 Other long term (current) drug therapy: Secondary | ICD-10-CM | POA: Diagnosis not present

## 2020-12-14 DIAGNOSIS — N202 Calculus of kidney with calculus of ureter: Secondary | ICD-10-CM | POA: Diagnosis present

## 2020-12-14 DIAGNOSIS — N201 Calculus of ureter: Secondary | ICD-10-CM

## 2020-12-14 DIAGNOSIS — G931 Anoxic brain damage, not elsewhere classified: Secondary | ICD-10-CM | POA: Diagnosis present

## 2020-12-14 DIAGNOSIS — E669 Obesity, unspecified: Secondary | ICD-10-CM | POA: Diagnosis present

## 2020-12-14 DIAGNOSIS — N136 Pyonephrosis: Secondary | ICD-10-CM | POA: Diagnosis present

## 2020-12-14 DIAGNOSIS — N400 Enlarged prostate without lower urinary tract symptoms: Secondary | ICD-10-CM | POA: Diagnosis present

## 2020-12-14 DIAGNOSIS — Z880 Allergy status to penicillin: Secondary | ICD-10-CM | POA: Diagnosis not present

## 2020-12-14 DIAGNOSIS — E876 Hypokalemia: Secondary | ICD-10-CM | POA: Diagnosis present

## 2020-12-14 DIAGNOSIS — Z20822 Contact with and (suspected) exposure to covid-19: Secondary | ICD-10-CM | POA: Diagnosis present

## 2020-12-14 DIAGNOSIS — Z9049 Acquired absence of other specified parts of digestive tract: Secondary | ICD-10-CM | POA: Diagnosis not present

## 2020-12-14 DIAGNOSIS — E785 Hyperlipidemia, unspecified: Secondary | ICD-10-CM | POA: Diagnosis present

## 2020-12-14 DIAGNOSIS — Z7982 Long term (current) use of aspirin: Secondary | ICD-10-CM | POA: Diagnosis not present

## 2020-12-14 DIAGNOSIS — N12 Tubulo-interstitial nephritis, not specified as acute or chronic: Secondary | ICD-10-CM

## 2020-12-14 DIAGNOSIS — H548 Legal blindness, as defined in USA: Secondary | ICD-10-CM | POA: Diagnosis present

## 2020-12-14 DIAGNOSIS — R300 Dysuria: Secondary | ICD-10-CM | POA: Diagnosis present

## 2020-12-14 HISTORY — PX: CYSTOSCOPY W/ URETERAL STENT PLACEMENT: SHX1429

## 2020-12-14 LAB — BASIC METABOLIC PANEL
Anion gap: 14 (ref 5–15)
Anion gap: 11 (ref 5–15)
BUN: 16 mg/dL (ref 6–20)
BUN: 14 mg/dL (ref 6–20)
CO2: 24 mmol/L (ref 22–32)
CO2: 27 mmol/L (ref 22–32)
Calcium: 8.6 mg/dL — ABNORMAL LOW (ref 8.9–10.3)
Calcium: 8.9 mg/dL (ref 8.9–10.3)
Chloride: 99 mmol/L (ref 98–111)
Chloride: 102 mmol/L (ref 98–111)
Creatinine, Ser: 0.67 mg/dL (ref 0.61–1.24)
Creatinine, Ser: 0.66 mg/dL (ref 0.61–1.24)
GFR, Estimated: >60 mL/min (ref >60)
GFR, Estimated: >60 mL/min (ref >60)
Glucose, Bld: 155 mg/dL — ABNORMAL HIGH (ref 70–99)
Glucose, Bld: 147 mg/dL — ABNORMAL HIGH (ref 70–99)
Potassium: 2.9 mmol/L — ABNORMAL LOW (ref 3.5–5.1)
Potassium: 3.3 mmol/L — ABNORMAL LOW (ref 3.5–5.1)
Sodium: 137 mmol/L (ref 135–145)
Sodium: 140 mmol/L (ref 135–145)

## 2020-12-14 LAB — CULTURE, BLOOD (ROUTINE X 2)

## 2020-12-14 LAB — CBC
HCT: 37.9 % — ABNORMAL LOW (ref 39.0–52.0)
Hemoglobin: 12.5 g/dL — ABNORMAL LOW (ref 13.0–17.0)
MCH: 27.7 pg (ref 26.0–34.0)
MCHC: 33 g/dL (ref 30.0–36.0)
MCV: 83.8 fL (ref 80.0–100.0)
Platelets: 265 10*3/uL (ref 150–400)
RBC: 4.52 MIL/uL (ref 4.22–5.81)
RDW: 15.3 % (ref 11.5–15.5)
WBC: 10.5 10*3/uL (ref 4.0–10.5)
nRBC: 0 % (ref 0.0–0.2)

## 2020-12-14 LAB — RESP PANEL BY RT-PCR (FLU A&B, COVID) ARPGX2
Influenza A by PCR: NEGATIVE
Influenza B by PCR: NEGATIVE
SARS Coronavirus 2 by RT PCR: NEGATIVE

## 2020-12-14 LAB — HIV ANTIBODY (ROUTINE TESTING W REFLEX): HIV Screen 4th Generation wRfx: NONREACTIVE

## 2020-12-14 LAB — MAGNESIUM: Magnesium: 1.8 mg/dL (ref 1.7–2.4)

## 2020-12-14 SURGERY — CYSTOSCOPY, WITH RETROGRADE PYELOGRAM AND URETERAL STENT INSERTION
Anesthesia: General | Site: Ureter | Laterality: Right

## 2020-12-14 MED ORDER — IOHEXOL 300 MG/ML  SOLN
INTRAMUSCULAR | Status: DC | PRN
  Administered 2020-12-14: 10 mL via URETHRAL

## 2020-12-14 MED ORDER — LIDOCAINE 2% (20 MG/ML) 5 ML SYRINGE
INTRAMUSCULAR | Status: AC
  Filled 2020-12-14: qty 5

## 2020-12-14 MED ORDER — ACETAMINOPHEN 10 MG/ML IV SOLN
INTRAVENOUS | Status: AC
  Filled 2020-12-14: qty 100

## 2020-12-14 MED ORDER — HYDRALAZINE HCL 20 MG/ML IJ SOLN: 10.0000 mg | INTRAMUSCULAR | Status: DC | PRN

## 2020-12-14 MED ORDER — FENTANYL CITRATE (PF) 250 MCG/5ML IJ SOLN
INTRAMUSCULAR | Status: AC
  Filled 2020-12-14: qty 5

## 2020-12-14 MED ORDER — OXYCODONE HCL 5 MG PO TABS: 5.0000 mg | ORAL_TABLET | Freq: Once | ORAL | Status: DC | PRN

## 2020-12-14 MED ORDER — FENTANYL CITRATE (PF) 100 MCG/2ML IJ SOLN
100.0000 ug | Freq: Once | INTRAMUSCULAR | Status: AC
  Administered 2020-12-14: 100 ug via INTRAVENOUS
  Filled 2020-12-14: qty 2

## 2020-12-14 MED ORDER — ACETAMINOPHEN 500 MG PO TABS: 1000.0000 mg | ORAL_TABLET | Freq: Once | ORAL | Status: DC | PRN

## 2020-12-14 MED ORDER — ACETAMINOPHEN 160 MG/5ML PO SOLN: 1000.0000 mg | Freq: Once | ORAL | Status: DC | PRN

## 2020-12-14 MED ORDER — MECLIZINE HCL 25 MG PO TABS: 25.0000 mg | ORAL_TABLET | Freq: Every day | ORAL | Status: DC | PRN

## 2020-12-14 MED ORDER — PANTOPRAZOLE SODIUM 40 MG PO TBEC
40.0000 mg | DELAYED_RELEASE_TABLET | Freq: Every day | ORAL | Status: DC
  Administered 2020-12-14 – 2020-12-15 (×2): 40 mg via ORAL
  Filled 2020-12-14 (×2): qty 1

## 2020-12-14 MED ORDER — LACTATED RINGERS IV SOLN: INTRAVENOUS | Status: DC | PRN

## 2020-12-14 MED ORDER — ACETAMINOPHEN 10 MG/ML IV SOLN: 1000.0000 mg | Freq: Once | INTRAVENOUS | Status: DC | PRN

## 2020-12-14 MED ORDER — OXYCODONE HCL 5 MG/5ML PO SOLN: 5.0000 mg | Freq: Once | ORAL | Status: DC | PRN

## 2020-12-14 MED ORDER — PROPOFOL 10 MG/ML IV BOLUS
INTRAVENOUS | Status: AC
  Filled 2020-12-14: qty 20

## 2020-12-14 MED ORDER — FENTANYL CITRATE (PF) 100 MCG/2ML IJ SOLN
INTRAMUSCULAR | Status: DC | PRN
  Administered 2020-12-14: 50 ug via INTRAVENOUS

## 2020-12-14 MED ORDER — ONDANSETRON HCL 4 MG/2ML IJ SOLN
INTRAMUSCULAR | Status: DC | PRN
  Administered 2020-12-14: 4 mg via INTRAVENOUS

## 2020-12-14 MED ORDER — FENTANYL CITRATE (PF) 100 MCG/2ML IJ SOLN
25.0000 ug | INTRAMUSCULAR | Status: DC | PRN
Start: 2020-12-14 — End: 2020-12-14

## 2020-12-14 MED ORDER — ONDANSETRON HCL 4 MG/2ML IJ SOLN
INTRAMUSCULAR | Status: AC
  Filled 2020-12-14: qty 2

## 2020-12-14 MED ORDER — HEPARIN SODIUM (PORCINE) 5000 UNIT/ML IJ SOLN
5000.0000 [IU] | Freq: Three times a day (TID) | INTRAMUSCULAR | Status: DC
  Administered 2020-12-14 – 2020-12-15 (×4): 5000 [IU] via SUBCUTANEOUS
  Filled 2020-12-14 (×5): qty 1

## 2020-12-14 MED ORDER — SODIUM CHLORIDE 0.9 % IV SOLN: INTRAVENOUS | Status: AC

## 2020-12-14 MED ORDER — SODIUM CHLORIDE 0.9 % IV SOLN
1.0000 g | Freq: Once | INTRAVENOUS | Status: AC
  Administered 2020-12-14: 1 g via INTRAVENOUS
  Filled 2020-12-14: qty 10

## 2020-12-14 MED ORDER — POTASSIUM CHLORIDE CRYS ER 20 MEQ PO TBCR
40.0000 meq | EXTENDED_RELEASE_TABLET | Freq: Once | ORAL | Status: AC
  Administered 2020-12-14: 40 meq via ORAL
  Filled 2020-12-14: qty 2

## 2020-12-14 MED ORDER — POTASSIUM CHLORIDE 10 MEQ/100ML IV SOLN
10.0000 meq | INTRAVENOUS | Status: AC
  Administered 2020-12-14 (×4): 10 meq via INTRAVENOUS
  Filled 2020-12-14 (×4): qty 100

## 2020-12-14 MED ORDER — DEXAMETHASONE SODIUM PHOSPHATE 10 MG/ML IJ SOLN
INTRAMUSCULAR | Status: AC
  Filled 2020-12-14: qty 1

## 2020-12-14 MED ORDER — ACETAMINOPHEN 650 MG RE SUPP: 650.0000 mg | Freq: Four times a day (QID) | RECTAL | Status: DC | PRN

## 2020-12-14 MED ORDER — ACETAMINOPHEN 10 MG/ML IV SOLN
INTRAVENOUS | Status: DC | PRN
  Administered 2020-12-14: 1000 mg via INTRAVENOUS

## 2020-12-14 MED ORDER — SODIUM CHLORIDE 0.9 % IV SOLN
1.0000 g | INTRAVENOUS | Status: DC
  Administered 2020-12-14: 1 g via INTRAVENOUS
  Filled 2020-12-14: qty 10

## 2020-12-14 MED ORDER — SODIUM CHLORIDE 0.9 % IR SOLN
Status: DC | PRN
  Administered 2020-12-14: 3000 mL

## 2020-12-14 MED ORDER — ATORVASTATIN CALCIUM 40 MG PO TABS
40.0000 mg | ORAL_TABLET | Freq: Every day | ORAL | Status: DC
  Administered 2020-12-14: 40 mg via ORAL
  Filled 2020-12-14: qty 1

## 2020-12-14 MED ORDER — ACETAMINOPHEN 325 MG PO TABS
650.0000 mg | ORAL_TABLET | Freq: Four times a day (QID) | ORAL | Status: DC | PRN
  Administered 2020-12-14 – 2020-12-15 (×2): 650 mg via ORAL
  Filled 2020-12-14 (×2): qty 2

## 2020-12-14 MED ORDER — FENOFIBRATE 160 MG PO TABS
160.0000 mg | ORAL_TABLET | Freq: Every day | ORAL | Status: DC
  Administered 2020-12-14 – 2020-12-15 (×2): 160 mg via ORAL
  Filled 2020-12-14 (×2): qty 1

## 2020-12-14 MED ORDER — MIDAZOLAM HCL 2 MG/2ML IJ SOLN
INTRAMUSCULAR | Status: AC
  Filled 2020-12-14: qty 2

## 2020-12-14 MED ORDER — PROPOFOL 10 MG/ML IV BOLUS
INTRAVENOUS | Status: DC | PRN
  Administered 2020-12-14: 120 mg via INTRAVENOUS

## 2020-12-14 MED ORDER — LIDOCAINE 2% (20 MG/ML) 5 ML SYRINGE
INTRAMUSCULAR | Status: DC | PRN
  Administered 2020-12-14: 75 mg via INTRAVENOUS

## 2020-12-14 MED ORDER — 0.9 % SODIUM CHLORIDE (POUR BTL) OPTIME
TOPICAL | Status: DC | PRN
  Administered 2020-12-14: 1000 mL

## 2020-12-14 SURGICAL SUPPLY — 15 items
BAG URO CATCHER STRL LF (MISCELLANEOUS) ×2 IMPLANT
CATH INTERMIT  6FR 70CM (CATHETERS) ×2 IMPLANT
CLOTH BEACON ORANGE TIMEOUT ST (SAFETY) ×2 IMPLANT
GLOVE SURG ENC MOIS LTX SZ6.5 (GLOVE) ×2 IMPLANT
GOWN STRL REUS W/ TWL LRG LVL3 (GOWN DISPOSABLE) ×1 IMPLANT
GOWN STRL REUS W/ TWL XL LVL3 (GOWN DISPOSABLE) IMPLANT
GOWN STRL REUS W/TWL LRG LVL3 (GOWN DISPOSABLE) ×6 IMPLANT
GOWN STRL REUS W/TWL XL LVL3 (GOWN DISPOSABLE) ×2
GUIDEWIRE STR DUAL SENSOR (WIRE) ×2 IMPLANT
KIT TURNOVER KIT A (KITS) ×2 IMPLANT
MANIFOLD NEPTUNE II (INSTRUMENTS) ×2 IMPLANT
PACK CYSTO (CUSTOM PROCEDURE TRAY) ×2 IMPLANT
STENT URET 6FRX26 CONTOUR (STENTS) ×1 IMPLANT
TUBING CONNECTING 10 (TUBING) ×2 IMPLANT
TUBING UROLOGY SET (TUBING) IMPLANT

## 2020-12-14 NOTE — ED Notes (Signed)
Report given to [place] @ [facility] OR.

## 2020-12-14 NOTE — H&P (Signed)
History and Physical    [name] ZJQ:[id number] DOB: Feb 08, 1961 DOA: 12/13/2020  PCP: System, Provider Not In  Patient coming from: [facility].  History obtained from patient's mother.  Patient.  Chief Complaint: Right flank pain.  HPI: [name] is a 60 y.o. male with history of anoxic brain injury, hypertension, hyperlipidemia, GERD has been experiencing right flank pain over the last 1 week.  Pain acutely worsened over the last 24 hours with nausea vomiting and was brought to the ER at [facility].  Denies chest pain or shortness of breath.  ED Course: In the ER patient was febrile with temperature 100.2 F and lab work showed leukocytosis with UA concerning for possible UTI CT renal study shows right UVJ high-grade obstruction with urolithiasis.  On-call neurologist Dr. Claudia Desanctis was consulted and patient transferred to [facility].  Covid test was negative.  Hospitalist was requested admission after patient had ureteral stent placed for further observation given the patient had fever at presentation with obstruction.  On my exam patient states his pain has resolved.  Labs also show mild leukocytosis.  Lactic acid was normal.    Review of Systems: As per HPI, rest all negative.   Past Medical History:  Diagnosis Date  . Blind   . GERD (gastroesophageal reflux disease)   . Hyperlipidemia   . Hypertension   . Hypoxic brain injury ([place])   . Muscle spasms of head and/or neck    and overall body  . Obesity     Past Surgical History:  Procedure Laterality Date  . CHOLECYSTECTOMY    . ESOPHAGOGASTRODUODENOSCOPY N/A 01/04/2013   WIO:XBDZH hiatal hernia/GASTRIC PolypS/MODERATE Non-erosive gastritis/SMALL nodule in the duodenal bulb  . high pressure shunt    . MALONEY DILATION N/A 01/04/2013   Procedure: MALONEY DILATION;  Surgeon: Danie Binder, MD;  Location: [facility];  Service: Endoscopy;  Laterality: N/A;  . MASS EXCISION Left 09/12/2013    Procedure: EXCISION NEOPLASM LEFT SHOULDER;  Surgeon: Scherry Ran, MD;  Location: [facility] ORS;  Service: General;  Laterality: Left;  . SAVORY DILATION N/A 01/04/2013   Procedure: SAVORY DILATION;  Surgeon: Danie Binder, MD;  Location: [facility];  Service: Endoscopy;  Laterality: N/A;     reports that he has never smoked. He has never used smokeless tobacco. He reports that he does not drink alcohol and does not use drugs.  Allergies  Allergen Reactions  . Penicillins Hives    Family History  Problem Relation Age of Onset  . Breast cancer Mother   . Kidney failure Father   . Breast cancer Sister     Prior to Admission medications   Medication Sig Start Date End Date Taking? Authorizing Provider  acetaminophen (TYLENOL) 500 MG tablet Take 500 mg by mouth every 6 (six) hours as needed for mild pain or moderate pain.    [provider]  aspirin 81 MG tablet Take 81 mg by mouth daily.    [provider]  atorvastatin (LIPITOR) 40 MG tablet Take 40 mg by mouth at bedtime.  12/05/12   [provider]  esomeprazole (NEXIUM) 40 MG capsule Take 40 mg by mouth daily at 12 noon.    [provider]  fenofibrate (TRICOR) 145 MG tablet Take 145 mg by mouth daily.  11/26/12   [provider]  hydrochlorothiazide (HYDRODIURIL) 25 MG tablet Take 50 mg by mouth daily.  11/26/12   [provider]  meclizine (ANTIVERT) 25 MG tablet Take 25 mg by mouth daily as needed for dizziness.     [provider]  Multiple Vitamin (MULTIVITAMIN) capsule Take 1 capsule by mouth daily.    [provider]  potassium chloride (K-DUR) 10 MEQ tablet Take 10 mEq by mouth daily.  12/05/12   [provider]  Vitamin D, Ergocalciferol, (DRISDOL) 50000 units CAPS capsule Take 50,000 Units by mouth every 7 (seven) days.    [provider]    Physical Exam: Constitutional: Moderately built and nourished. Vitals:   12/14/20 0439  12/14/20 0445 12/14/20 0500 12/14/20 0542  BP: 140/80 140/75 122/77   Pulse: 68 70 65   Resp: 15 17 17    Temp: 98.9 F (37.2 C)  99.4 F (37.4 C)   TempSrc:      SpO2: 97% 92% 94%   Weight:    107 kg  Height:    5\' 6"  (1.676 m)   Eyes: Anicteric no pallor. ENMT: No discharge from the ears eyes nose or mouth. Neck: No mass felt.  No neck rigidity. Respiratory: No rhonchi or crepitations. Cardiovascular: S1-S2 heard. Abdomen: Soft nontender bowel sounds present. Musculoskeletal: No edema. Skin: No rash. Neurologic: Alert awake oriented to name and place.  Moving all extremities. Psychiatric: Oriented to name and place.   Labs on Admission: I have personally reviewed following labs and imaging studies  CBC: Recent Labs  Lab 12/13/20 2125  WBC 13.0*  NEUTROABS 9.1*  HGB 13.5  HCT 40.8  MCV 84.6  PLT 174   Basic Metabolic Panel: Recent Labs  Lab 12/13/20 2125  NA 137  K 3.0*  CL 99  CO2 27  GLUCOSE 156*  BUN 20  CREATININE 0.75  CALCIUM 9.2   GFR: Estimated Creatinine Clearance: 116.9 mL/min (by C-G formula based on SCr of 0.75 mg/dL). Liver Function Tests: No results for input(s): AST, ALT, ALKPHOS, BILITOT, PROT, ALBUMIN in the last 168 hours. No results for input(s): LIPASE, AMYLASE in the last 168 hours. No results for input(s): AMMONIA in the last 168 hours. Coagulation Profile: No results for input(s): INR, PROTIME in the last 168 hours. Cardiac Enzymes: No results for input(s): CKTOTAL, CKMB, CKMBINDEX, TROPONINI in the last 168 hours. BNP (last 3 results) No results for input(s): PROBNP in the last 8760 hours. HbA1C: No results for input(s): HGBA1C in the last 72 hours. CBG: No results for input(s): GLUCAP in the last 168 hours. Lipid Profile: No results for input(s): CHOL, HDL, LDLCALC, TRIG, CHOLHDL, LDLDIRECT in the last 72 hours. Thyroid Function Tests: No results for input(s): TSH, T4TOTAL, FREET4, T3FREE, THYROIDAB in the last 72  hours. Anemia Panel: No results for input(s): VITAMINB12, FOLATE, FERRITIN, TIBC, IRON, RETICCTPCT in the last 72 hours. Urine analysis:    Component Value Date/Time   COLORURINE YELLOW 12/13/2020 2121   APPEARANCEUR HAZY (A) 12/13/2020 2121   LABSPEC 1.012 12/13/2020 2121   PHURINE 7.0 12/13/2020 2121   GLUCOSEU NEGATIVE 12/13/2020 2121   HGBUR MODERATE (A) 12/13/2020 2121   BILIRUBINUR NEGATIVE 12/13/2020 2121   KETONESUR NEGATIVE 12/13/2020 2121   PROTEINUR NEGATIVE 12/13/2020 2121   NITRITE NEGATIVE 12/13/2020 2121   LEUKOCYTESUR NEGATIVE 12/13/2020 2121   Sepsis Labs: @LABRCNTIP (procalcitonin:4,lacticidven:4) ) Recent Results (from the past 240 hour(s))  Blood culture (routine x 2)     Status: None (Preliminary result)   Collection Time: 12/13/20  9:11 PM   Specimen: Right Antecubital; Blood  Result Value Ref Range Status   Specimen Description RIGHT  ANTECUBITAL  Final   Special Requests   Final    BOTTLES DRAWN AEROBIC AND ANAEROBIC Blood Culture adequate volume Performed at [facility], [address] [place], [place] [address]    Culture PENDING  Incomplete   Report Status PENDING  Incomplete  Blood culture (routine x 2)     Status: None (Preliminary result)   Collection Time: 12/13/20 11:18 PM   Specimen: Left Antecubital; Blood  Result Value Ref Range Status   Specimen Description LEFT ANTECUBITAL  Final   Special Requests   Final    BOTTLES DRAWN AEROBIC AND ANAEROBIC Blood Culture adequate volume Performed at [facility], [address], [place], [place] [address]    Culture PENDING  Incomplete   Report Status PENDING  Incomplete  Resp Panel by RT-PCR (Flu A&B, Covid) Nasopharyngeal Swab     Status: None   Collection Time: 12/14/20 12:44 AM   Specimen: Nasopharyngeal Swab; Nasopharyngeal(NP) swabs in vial transport medium  Result Value Ref Range Status   SARS Coronavirus 2 by RT PCR NEGATIVE NEGATIVE Final    Comment: (NOTE) SARS-CoV-2 target nucleic  acids are NOT DETECTED.  The SARS-CoV-2 RNA is generally detectable in upper respiratory specimens during the acute phase of infection. The lowest concentration of SARS-CoV-2 viral copies this assay can detect is 138 copies/mL. A negative result does not preclude SARS-Cov-2 infection and should not be used as the sole basis for treatment or other patient management decisions. A negative result may occur with  improper specimen collection/handling, submission of specimen other than nasopharyngeal swab, presence of viral mutation(s) within the areas targeted by this assay, and inadequate number of viral copies(<138 copies/mL). A negative result must be combined with clinical observations, patient history, and epidemiological information. The expected result is Negative.  Fact Sheet for Patients:  EntrepreneurPulse.com.au  Fact Sheet for Healthcare Providers:  IncredibleEmployment.be  This test is no t yet approved or cleared by the [place] FDA and  has been authorized for detection and/or diagnosis of SARS-CoV-2 by FDA under an Emergency Use Authorization (EUA). This EUA will remain  in effect (meaning this test can be used) for the duration of the COVID-19 declaration under Section 564(b)(1) of the Act, 21 U.S.C.section 360bbb-3(b)(1), unless the authorization is terminated  or revoked sooner.       Influenza A by PCR NEGATIVE NEGATIVE Final   Influenza B by PCR NEGATIVE NEGATIVE Final    Comment: (NOTE) The Xpert Xpress SARS-CoV-2/FLU/RSV plus assay is intended as an aid in the diagnosis of influenza from Nasopharyngeal swab specimens and should not be used as a sole basis for treatment. Nasal washings and aspirates are unacceptable for Xpert Xpress SARS-CoV-2/FLU/RSV testing.  Fact Sheet for Patients: EntrepreneurPulse.com.au  Fact Sheet for Healthcare Providers: IncredibleEmployment.be  This  test is not yet approved or cleared by the [place] FDA and has been authorized for detection and/or diagnosis of SARS-CoV-2 by FDA under an Emergency Use Authorization (EUA). This EUA will remain in effect (meaning this test can be used) for the duration of the COVID-19 declaration under Section 564(b)(1) of the Act, 21 U.S.C. section 360bbb-3(b)(1), unless the authorization is terminated or revoked.  Performed at [facility], [address], [place], [place] [address]      Radiological Exams on Admission: DG Chest [facility] 1 View  Result Date: 12/14/2020 CLINICAL DATA:  Fever, urinary frequency EXAM: PORTABLE CHEST 1 VIEW COMPARISON:  05/07/2019 FINDINGS: Single frontal view of the chest demonstrates an unremarkable cardiac silhouette. Small  hiatal hernia. No airspace disease, effusion, or pneumothorax. Ventriculostomy catheter tubing overlies right chest. IMPRESSION: 1. No acute intrathoracic process. 2. Hiatal hernia. Electronically Signed   By: Randa Ngo M.D.   On: 12/14/2020 00:16   DG C-Arm 1-60 Min-No Report  Result Date: 12/14/2020 Fluoroscopy was utilized by the requesting physician.  No radiographic interpretation.   CT Renal Stone Study  Result Date: 12/14/2020 CLINICAL DATA:  Right lower back pain, increased urinary frequency EXAM: CT ABDOMEN AND PELVIS WITHOUT CONTRAST TECHNIQUE: Multidetector CT imaging of the abdomen and pelvis was performed following the standard protocol without IV contrast. COMPARISON:  None. FINDINGS: Lower chest: No acute pleural or parenchymal lung disease. Small hiatal hernia. Unenhanced CT was performed per clinician order. Lack of IV contrast limits sensitivity and specificity, especially for evaluation of abdominal/pelvic solid viscera. Hepatobiliary: No focal liver abnormality is seen. Status post cholecystectomy. No biliary dilatation. Pancreas: Unremarkable. No pancreatic ductal dilatation or surrounding inflammatory changes. Spleen: Normal  in size without focal abnormality. Adrenals/Urinary Tract: There is high-grade right-sided obstructive uropathy due to an obstructing 5 mm right UVJ calculus, reference image 77/2. Nonobstructing 3 mm upper pole left renal calculus is noted. No left-sided obstructive uropathy. Indeterminate 2.1 cm hypodensity off the ventral aspect mid left kidney demonstrates Hounsfield attenuation of 27. 1.2 cm cyst is identified off the lower pole left kidney. The adrenals are unremarkable.  Bladder is grossly normal. Stomach/Bowel: No bowel obstruction or ileus. Normal appendix right lower quadrant. Scattered diverticulosis of the distal colon without diverticulitis. No bowel wall thickening or inflammatory change. Vascular/Lymphatic: Aortic atherosclerosis. No enlarged abdominal or pelvic lymph nodes. Reproductive: Prostate is unremarkable. Other: No free fluid or free gas. No abdominal wall hernia. Ventriculostomy catheter enters the abdomen in the right upper quadrant, tip within the right hemipelvis. Musculoskeletal: No acute or destructive bony lesions. Reconstructed images demonstrate no additional findings. IMPRESSION: 1. High-grade right-sided obstructive uropathy due to a 5 mm obstructing right UVJ calculus. 2. Nonobstructing 3 mm left renal calculus. 3. Indeterminate 2.1 cm hypodensity ventral left kidney, likely a cyst. This could be confirmed by nonemergent outpatient ultrasound if further imaging is desired. 4. Diverticulosis without diverticulitis. 5. Small hiatal hernia. 6.  Aortic Atherosclerosis (ICD10-I70.0). Electronically Signed   By: Randa Ngo M.D.   On: 12/14/2020 00:15     Assessment/Plan Principal Problem:   Ureterovesical junction (UVJ) obstruction Active Problems:   GERD (gastroesophageal reflux disease)   Ureteral stone   Pyelonephritis   Essential hypertension    1. Right ureterovesicular junction obstruction due to lithiasis with fever for which patient had ureteral stent placed by  urologist Dr. Claudia Desanctis.  Was started on empiric antibiotics follow cultures continue with gentle hydration. 2. Hypertension presently holding hydrochlorothiazide due to patient receiving fluids and also patient having low potassium levels.  As needed IV hydralazine. 3. Hypokalemia could be from nausea vomiting and also hydrochlorothiazide use.  Repeat metabolic panel has been ordered. 4. History of hyperlipidemia on statins and fenofibrate. 5. History of GERD on PPI. 6. History of anoxic brain injury.   DVT prophylaxis: Heparin. Code Status: Full code. Family Communication: Patient's mother at the bedside. Disposition Plan: Back to facility when stable. Consults called: Urologist. Admission status: Observation.   Rise Patience MD [facility] Pager [phone].  If 7PM-7AM, please contact night-coverage [email or web address] Password [facility]  12/14/2020, 6:22 AM

## 2020-12-14 NOTE — Progress Notes (Signed)
PROGRESS NOTE   ERCIL CASSIS  BPZ:[id number]    DOB: 07-03-1961    DOA: 12/13/2020  PCP: System, Provider Not In   I have briefly reviewed patients previous medical records in [facility].  Chief Complaint  Patient presents with  . Urinary Frequency    Urine smells foul, urine is thick.    Brief Narrative:  60 year old male, resident of an assisted living facility, moves around with help of a wheelchair, medical history significant for and not limited to anoxic brain injury, legal blindness, hypertension, hyperlipidemia, GERD, obesity, developed right flank pain x1 week which acutely worsened 24 hours prior to admission with associated nausea and vomiting, initially brought to [facility] ED where noted to have fever of 100.2 F, leukocytosis, UA concerning for UTI and CT renal study showed right obstructing ureteral calculus.  Patient was transferred to [facility], urology consulted and s/p cystoscopy and right ureteral stent placement 3/14.  Improved   Assessment & Plan:  Principal Problem:   Ureterovesical junction (UVJ) obstruction Active Problems:   GERD (gastroesophageal reflux disease)   Ureteral stone   Pyelonephritis   Essential hypertension   Right obstructing ureteral calculus with UTI/complicated UTI Urology was consulted.  S/p cystoscopy, right ureteral stent placement and retrograde pyelography.  Continue empirically started IV ceftriaxone pending urine culture results.  Blood cultures x2: Negative to date.  Per Urology, he will need definitive stone management in 2 to 3 weeks.  Currently without Foley catheter.  Improved.  Hypokalemia Due to GI losses from nausea, vomiting and HCTZ use.  Replace aggressively and follow.  Magnesium 1.8.  Essential hypertension Controlled off of medications.  HCTZ on hold due to severe hypokalemia.  As needed IV hydralazine.  Hyperlipidemia Continue statins and fenofibrate.  GERD PPI  Anoxic brain  injury Mental status at baseline.  Indeterminate 2.1 cm hypodensity ventral left kidney Seen on CT renal study 3/14.  Radiology indicate that this is likely a cyst.  Outpatient follow-up with urology and could consider nonemergent outpatient ultrasound.  Body mass index is 38.07 kg/m.    DVT prophylaxis: heparin injection 5,000 Units Start: 12/14/20 0715     Code Status: Full Code Family Communication: Discussed in detail with patient's mother at bedside, updated care and answered all questions. Disposition:  Status is: Observation  The patient will require care spanning > 2 midnights and should be moved to inpatient because: Inpatient level of care appropriate due to severity of illness  Dispo: The patient is from: ALF              Anticipated d/c is to: ALF              Patient currently is not medically stable to d/c.   Difficult to place patient No        Consultants:   Urology  Procedures:   Cystoscopy, right ureteral stent placement and retrograde pyelography 3/14.  Antimicrobials:    Anti-infectives (From admission, onward)   Start     Dose/Rate Route Frequency Ordered Stop   12/14/20 2200  cefTRIAXone (ROCEPHIN) 1 g in sodium chloride 0.9 % 100 mL IVPB        1 g 200 mL/hr over 30 Minutes Intravenous Every 24 hours 12/14/20 0622     12/14/20 0045  cefTRIAXone (ROCEPHIN) 1 g in sodium chloride 0.9 % 100 mL IVPB        1 g 200 mL/hr over 30 Minutes Intravenous  Once 12/14/20 0043 12/14/20  0119        Subjective:  Interviewed patient along with mother and patient's RN at bedside.  Reports he is feeling much better.  No further flank or back pain.  No nausea or vomiting.  Objective:   Vitals:   12/14/20 0445 12/14/20 0500 12/14/20 0542 12/14/20 0928  BP: 140/75 122/77  129/74  Pulse: 70 65  (!) 57  Resp: 17 17  20   Temp:  99.4 F (37.4 C)  98.3 F (36.8 C)  TempSrc:    Oral  SpO2: 92% 94%  96%  Weight:   107 kg   Height:   5\' 6"  (1.676 m)      General exam: Young male, moderately built and obese lying comfortably supine in bed without distress.  Oral mucosa with borderline hydration. Respiratory system: Clear to auscultation. Respiratory effort normal. Cardiovascular system: S1 & S2 heard, RRR. No JVD, murmurs, rubs, gallops or clicks. No pedal edema. Gastrointestinal system: Abdomen is nondistended, soft and nontender. No organomegaly or masses felt. Normal bowel sounds heard. Central nervous system: Alert and oriented. No focal neurological deficits. Extremities: Symmetric 5 x 5 power. Skin: No rashes, lesions or ulcers Psychiatry: Judgement and insight may be somewhat impaired. Mood & affect appropriate.     Data Reviewed:   I have personally reviewed following labs and imaging studies   CBC: Recent Labs  Lab 12/13/20 2125 12/14/20 0658  WBC 13.0* 10.5  NEUTROABS 9.1*  --   HGB 13.5 12.5*  HCT 40.8 37.9*  MCV 84.6 83.8  PLT 298 127    Basic Metabolic Panel: Recent Labs  Lab 12/13/20 2125 12/14/20 0658  NA 137 137  K 3.0* 2.9*  CL 99 99  CO2 27 24  GLUCOSE 156* 155*  BUN 20 16  CREATININE 0.75 0.67  CALCIUM 9.2 8.6*  MG  --  1.8    Liver Function Tests: No results for input(s): AST, ALT, ALKPHOS, BILITOT, PROT, ALBUMIN in the last 168 hours.  CBG: No results for input(s): GLUCAP in the last 168 hours.  Microbiology Studies:   Recent Results (from the past 240 hour(s))  Blood culture (routine x 2)     Status: None (Preliminary result)   Collection Time: 12/13/20  9:11 PM   Specimen: Right Antecubital; Blood  Result Value Ref Range Status   Specimen Description RIGHT ANTECUBITAL  Final   Special Requests   Final    BOTTLES DRAWN AEROBIC AND ANAEROBIC Blood Culture adequate volume   Culture   Final    NO GROWTH < 12 HOURS Performed at [facility], [address] [place], [place] [address]    Report Status PENDING  Incomplete  Blood culture (routine x 2)     Status: None (Preliminary  result)   Collection Time: 12/13/20 11:18 PM   Specimen: Left Antecubital; Blood  Result Value Ref Range Status   Specimen Description LEFT ANTECUBITAL  Final   Special Requests   Final    BOTTLES DRAWN AEROBIC AND ANAEROBIC Blood Culture adequate volume   Culture   Final    NO GROWTH < 12 HOURS Performed at [facility], [address], [place], [place] [address]    Report Status PENDING  Incomplete  Resp Panel by RT-PCR (Flu A&B, Covid) Nasopharyngeal Swab     Status: None   Collection Time: 12/14/20 12:44 AM   Specimen: Nasopharyngeal Swab; Nasopharyngeal(NP) swabs in vial transport medium  Result Value Ref Range Status   SARS Coronavirus 2 by RT  PCR NEGATIVE NEGATIVE Final    Comment: (NOTE) SARS-CoV-2 target nucleic acids are NOT DETECTED.  The SARS-CoV-2 RNA is generally detectable in upper respiratory specimens during the acute phase of infection. The lowest concentration of SARS-CoV-2 viral copies this assay can detect is 138 copies/mL. A negative result does not preclude SARS-Cov-2 infection and should not be used as the sole basis for treatment or other patient management decisions. A negative result may occur with  improper specimen collection/handling, submission of specimen other than nasopharyngeal swab, presence of viral mutation(s) within the areas targeted by this assay, and inadequate number of viral copies(<138 copies/mL). A negative result must be combined with clinical observations, patient history, and epidemiological information. The expected result is Negative.  Fact Sheet for Patients:  EntrepreneurPulse.com.au  Fact Sheet for Healthcare Providers:  IncredibleEmployment.be  This test is no t yet approved or cleared by the [place] FDA and  has been authorized for detection and/or diagnosis of SARS-CoV-2 by FDA under an Emergency Use Authorization (EUA). This EUA will remain  in effect (meaning this test can be  used) for the duration of the COVID-19 declaration under Section 564(b)(1) of the Act, 21 U.S.C.section 360bbb-3(b)(1), unless the authorization is terminated  or revoked sooner.       Influenza A by PCR NEGATIVE NEGATIVE Final   Influenza B by PCR NEGATIVE NEGATIVE Final    Comment: (NOTE) The Xpert Xpress SARS-CoV-2/FLU/RSV plus assay is intended as an aid in the diagnosis of influenza from Nasopharyngeal swab specimens and should not be used as a sole basis for treatment. Nasal washings and aspirates are unacceptable for Xpert Xpress SARS-CoV-2/FLU/RSV testing.  Fact Sheet for Patients: EntrepreneurPulse.com.au  Fact Sheet for Healthcare Providers: IncredibleEmployment.be  This test is not yet approved or cleared by the [place] FDA and has been authorized for detection and/or diagnosis of SARS-CoV-2 by FDA under an Emergency Use Authorization (EUA). This EUA will remain in effect (meaning this test can be used) for the duration of the COVID-19 declaration under Section 564(b)(1) of the Act, 21 U.S.C. section 360bbb-3(b)(1), unless the authorization is terminated or revoked.  Performed at [facility], [address], [place], [place] [address]      Radiology Studies:  [name] Chest [facility] 1 View  Result Date: 12/14/2020 CLINICAL DATA:  Fever, urinary frequency EXAM: PORTABLE CHEST 1 VIEW COMPARISON:  05/07/2019 FINDINGS: Single frontal view of the chest demonstrates an unremarkable cardiac silhouette. Small hiatal hernia. No airspace disease, effusion, or pneumothorax. Ventriculostomy catheter tubing overlies right chest. IMPRESSION: 1. No acute intrathoracic process. 2. Hiatal hernia. Electronically Signed   By: Randa Ngo M.D.   On: 12/14/2020 00:16   DG C-Arm 1-60 Min-No Report  Result Date: 12/14/2020 Fluoroscopy was utilized by the requesting physician.  No radiographic interpretation.   CT Renal Stone Study  Result Date:  12/14/2020 CLINICAL DATA:  Right lower back pain, increased urinary frequency EXAM: CT ABDOMEN AND PELVIS WITHOUT CONTRAST TECHNIQUE: Multidetector CT imaging of the abdomen and pelvis was performed following the standard protocol without IV contrast. COMPARISON:  None. FINDINGS: Lower chest: No acute pleural or parenchymal lung disease. Small hiatal hernia. Unenhanced CT was performed per clinician order. Lack of IV contrast limits sensitivity and specificity, especially for evaluation of abdominal/pelvic solid viscera. Hepatobiliary: No focal liver abnormality is seen. Status post cholecystectomy. No biliary dilatation. Pancreas: Unremarkable. No pancreatic ductal dilatation or surrounding inflammatory changes. Spleen: Normal in size without focal abnormality. Adrenals/Urinary Tract: There is high-grade right-sided obstructive uropathy  due to an obstructing 5 mm right UVJ calculus, reference image 77/2. Nonobstructing 3 mm upper pole left renal calculus is noted. No left-sided obstructive uropathy. Indeterminate 2.1 cm hypodensity off the ventral aspect mid left kidney demonstrates Hounsfield attenuation of 27. 1.2 cm cyst is identified off the lower pole left kidney. The adrenals are unremarkable.  Bladder is grossly normal. Stomach/Bowel: No bowel obstruction or ileus. Normal appendix right lower quadrant. Scattered diverticulosis of the distal colon without diverticulitis. No bowel wall thickening or inflammatory change. Vascular/Lymphatic: Aortic atherosclerosis. No enlarged abdominal or pelvic lymph nodes. Reproductive: Prostate is unremarkable. Other: No free fluid or free gas. No abdominal wall hernia. Ventriculostomy catheter enters the abdomen in the right upper quadrant, tip within the right hemipelvis. Musculoskeletal: No acute or destructive bony lesions. Reconstructed images demonstrate no additional findings. IMPRESSION: 1. High-grade right-sided obstructive uropathy due to a 5 mm obstructing right  UVJ calculus. 2. Nonobstructing 3 mm left renal calculus. 3. Indeterminate 2.1 cm hypodensity ventral left kidney, likely a cyst. This could be confirmed by nonemergent outpatient ultrasound if further imaging is desired. 4. Diverticulosis without diverticulitis. 5. Small hiatal hernia. 6.  Aortic Atherosclerosis (ICD10-I70.0). Electronically Signed   By: Randa Ngo M.D.   On: 12/14/2020 00:15     Scheduled Meds:   . atorvastatin  40 mg Oral QHS  . fenofibrate  160 mg Oral Daily  . heparin  5,000 Units Subcutaneous Q8H  . pantoprazole  40 mg Oral Daily    Continuous Infusions:   . sodium chloride 75 mL/hr at 12/14/20 1000  . acetaminophen    . cefTRIAXone (ROCEPHIN)  IV    . potassium chloride 100 mL/hr at 12/14/20 1000     LOS: 0 days     Vernell Leep, MD, [place], [place]. [facility]    To contact the attending provider between 7A-7P or the covering provider during after hours 7P-7A, please log into the web site [email or web address] and access using universal [place] password for that web site. If you do not have the password, please call the hospital operator.  12/14/2020, 11:03 AM

## 2020-12-14 NOTE — Plan of Care (Signed)

## 2020-12-14 NOTE — Transfer of Care (Signed)
Immediate Anesthesia Transfer of Care Note  Patient: [name]  Procedure(s) Performed: CYSTOSCOPY WITH RETROGRADE PYELOGRAM/URETERAL STENT PLACEMENT (Right Ureter)  Patient Location: PACU  Anesthesia Type:General  Level of Consciousness: awake, alert , oriented and patient cooperative  Airway & Oxygen Therapy: Patient Spontanous Breathing and Patient connected to face mask oxygen  Post-op Assessment: Report given to RN, Post -op Vital signs reviewed and stable and Patient moving all extremities X 4  Post vital signs: stable  Last Vitals:  Vitals Value Taken Time  BP 140/75 12/14/20 0445  Temp 37.2 C 12/14/20 0439  Pulse 67 12/14/20 0452  Resp 15 12/14/20 0452  SpO2 94 % 12/14/20 0452  Vitals shown include unvalidated device data.  Last Pain:  Vitals:   12/14/20 0439  TempSrc:   PainSc: 0-No pain         Complications: No complications documented.

## 2020-12-14 NOTE — Progress Notes (Signed)
Urology Inpatient Progress Report   Intv/Subj: Patient underwent urgent right ureteral stent placement last.  Principal Problem:   Ureterovesical junction (UVJ) obstruction Active Problems:   GERD (gastroesophageal reflux disease)   Ureteral stone   Pyelonephritis   Essential hypertension  Current Facility-Administered Medications  Medication Dose Route Frequency Provider Last Rate Last Admin   0.9 %  sodium chloride infusion   Intravenous Continuous Hongalgi, Anand D, MD 75 mL/hr at 12/14/20 1000 Infusion Verify at 12/14/20 1000   acetaminophen (OFIRMEV) 10 MG/ML IV            acetaminophen (TYLENOL) tablet 650 mg  650 mg Oral Q6H PRN Rise Patience, MD       Or   acetaminophen (TYLENOL) suppository 650 mg  650 mg Rectal Q6H PRN Rise Patience, MD       atorvastatin (LIPITOR) tablet 40 mg  40 mg Oral QHS Rise Patience, MD       cefTRIAXone (ROCEPHIN) 1 g in sodium chloride 0.9 % 100 mL IVPB  1 g Intravenous Q24H Rise Patience, MD       fenofibrate tablet 160 mg  160 mg Oral Daily Rise Patience, MD   160 mg at 12/14/20 0954   heparin injection 5,000 Units  5,000 Units Subcutaneous Q8H Rise Patience, MD   5,000 Units at 12/14/20 2094   hydrALAZINE (APRESOLINE) injection 10 mg  10 mg Intravenous Q4H PRN Rise Patience, MD       meclizine (ANTIVERT) tablet 25 mg  25 mg Oral Daily PRN Rise Patience, MD       pantoprazole (PROTONIX) EC tablet 40 mg  40 mg Oral Daily Rise Patience, MD   40 mg at 12/14/20 0954   potassium chloride 10 mEq in 100 mL IVPB  10 mEq Intravenous Q1 Hr x 4 Hongalgi, Everlene Farrier D, MD 75 mL/hr at 12/14/20 1105 10 mEq at 12/14/20 1105     Objective: Vital: Vitals:   12/14/20 0445 12/14/20 0500 12/14/20 0542 12/14/20 0928  BP: 140/75 122/77  129/74  Pulse: 70 65  (!) 57  Resp: 17 17  20   Temp:  99.4 F (37.4 C)  98.3 F (36.8 C)  TempSrc:    Oral  SpO2: 92% 94%  96%  Weight:   107 kg   Height:   5\' 6"   (1.676 m)    I/Os: I/O last 3 completed shifts: In: 1500 [I.V.:700; Other:100; IV Piggyback:700] Out: -   Physical Exam:  General: Patient is in no apparent distress Lungs: Normal respiratory effort, chest expands symmetrically. GI: The abdomen is soft and nontender  Ext: lower extremities symmetric  Lab Results: Recent Labs    12/13/20 2125 12/14/20 0658  WBC 13.0* 10.5  HGB 13.5 12.5*  HCT 40.8 37.9*   Recent Labs    12/13/20 2125 12/14/20 0658  NA 137 137  K 3.0* 2.9*  CL 99 99  CO2 27 24  GLUCOSE 156* 155*  BUN 20 16  CREATININE 0.75 0.67  CALCIUM 9.2 8.6*   No results for input(s): LABPT, INR in the last 72 hours. No results for input(s): LABURIN in the last 72 hours. Results for orders placed or performed during the hospital encounter of 12/13/20  Blood culture (routine x 2)     Status: None (Preliminary result)   Collection Time: 12/13/20  9:11 PM   Specimen: Right Antecubital; Blood  Result Value Ref Range Status   Specimen Description RIGHT ANTECUBITAL  Final   Special Requests   Final    BOTTLES DRAWN AEROBIC AND ANAEROBIC Blood Culture adequate volume   Culture   Final    NO GROWTH < 12 HOURS Performed at [facility], [address], [place], [place] [address]    Report Status PENDING  Incomplete  Blood culture (routine x 2)     Status: None (Preliminary result)   Collection Time: 12/13/20 11:18 PM   Specimen: Left Antecubital; Blood  Result Value Ref Range Status   Specimen Description LEFT ANTECUBITAL  Final   Special Requests   Final    BOTTLES DRAWN AEROBIC AND ANAEROBIC Blood Culture adequate volume   Culture   Final    NO GROWTH < 12 HOURS Performed at [facility], [address] [place], [place] [address]    Report Status PENDING  Incomplete  Resp Panel by RT-PCR (Flu A&B, Covid) Nasopharyngeal Swab     Status: None   Collection Time: 12/14/20 12:44 AM   Specimen: Nasopharyngeal Swab; Nasopharyngeal(NP) swabs in vial transport medium   Result Value Ref Range Status   SARS Coronavirus 2 by RT PCR NEGATIVE NEGATIVE Final    Comment: (NOTE) SARS-CoV-2 target nucleic acids are NOT DETECTED.  The SARS-CoV-2 RNA is generally detectable in upper respiratory specimens during the acute phase of infection. The lowest concentration of SARS-CoV-2 viral copies this assay can detect is 138 copies/mL. A negative result does not preclude SARS-Cov-2 infection and should not be used as the sole basis for treatment or other patient management decisions. A negative result may occur with  improper specimen collection/handling, submission of specimen other than nasopharyngeal swab, presence of viral mutation(s) within the areas targeted by this assay, and inadequate number of viral copies(<138 copies/mL). A negative result must be combined with clinical observations, patient history, and epidemiological information. The expected result is Negative.  Fact Sheet for Patients:  EntrepreneurPulse.com.au  Fact Sheet for Healthcare Providers:  IncredibleEmployment.be  This test is no t yet approved or cleared by the [place] FDA and  has been authorized for detection and/or diagnosis of SARS-CoV-2 by FDA under an Emergency Use Authorization (EUA). This EUA will remain  in effect (meaning this test can be used) for the duration of the COVID-19 declaration under Section 564(b)(1) of the Act, 21 U.S.C.section 360bbb-3(b)(1), unless the authorization is terminated  or revoked sooner.       Influenza A by PCR NEGATIVE NEGATIVE Final   Influenza B by PCR NEGATIVE NEGATIVE Final    Comment: (NOTE) The Xpert Xpress SARS-CoV-2/FLU/RSV plus assay is intended as an aid in the diagnosis of influenza from Nasopharyngeal swab specimens and should not be used as a sole basis for treatment. Nasal washings and aspirates are unacceptable for Xpert Xpress SARS-CoV-2/FLU/RSV testing.  Fact Sheet for  Patients: EntrepreneurPulse.com.au  Fact Sheet for Healthcare Providers: IncredibleEmployment.be  This test is not yet approved or cleared by the [place] FDA and has been authorized for detection and/or diagnosis of SARS-CoV-2 by FDA under an Emergency Use Authorization (EUA). This EUA will remain in effect (meaning this test can be used) for the duration of the COVID-19 declaration under Section 564(b)(1) of the Act, 21 U.S.C. section 360bbb-3(b)(1), unless the authorization is terminated or revoked.  Performed at [facility], [address] [place], [place] [address]     Studies/Results: [facility] Chest [facility] 1 View  Result Date: 12/14/2020 CLINICAL DATA:  Fever, urinary frequency EXAM: PORTABLE CHEST 1 VIEW COMPARISON:  05/07/2019 FINDINGS: Single frontal  view of the chest demonstrates an unremarkable cardiac silhouette. Small hiatal hernia. No airspace disease, effusion, or pneumothorax. Ventriculostomy catheter tubing overlies right chest. IMPRESSION: 1. No acute intrathoracic process. 2. Hiatal hernia. Electronically Signed   By: Randa Ngo M.D.   On: 12/14/2020 00:16   DG C-Arm 1-60 Min-No Report  Result Date: 12/14/2020 Fluoroscopy was utilized by the requesting physician.  No radiographic interpretation.   CT Renal Stone Study  Result Date: 12/14/2020 CLINICAL DATA:  Right lower back pain, increased urinary frequency EXAM: CT ABDOMEN AND PELVIS WITHOUT CONTRAST TECHNIQUE: Multidetector CT imaging of the abdomen and pelvis was performed following the standard protocol without IV contrast. COMPARISON:  None. FINDINGS: Lower chest: No acute pleural or parenchymal lung disease. Small hiatal hernia. Unenhanced CT was performed per clinician order. Lack of IV contrast limits sensitivity and specificity, especially for evaluation of abdominal/pelvic solid viscera. Hepatobiliary: No focal liver abnormality is seen. Status post cholecystectomy. No  biliary dilatation. Pancreas: Unremarkable. No pancreatic ductal dilatation or surrounding inflammatory changes. Spleen: Normal in size without focal abnormality. Adrenals/Urinary Tract: There is high-grade right-sided obstructive uropathy due to an obstructing 5 mm right UVJ calculus, reference image 77/2. Nonobstructing 3 mm upper pole left renal calculus is noted. No left-sided obstructive uropathy. Indeterminate 2.1 cm hypodensity off the ventral aspect mid left kidney demonstrates Hounsfield attenuation of 27. 1.2 cm cyst is identified off the lower pole left kidney. The adrenals are unremarkable.  Bladder is grossly normal. Stomach/Bowel: No bowel obstruction or ileus. Normal appendix right lower quadrant. Scattered diverticulosis of the distal colon without diverticulitis. No bowel wall thickening or inflammatory change. Vascular/Lymphatic: Aortic atherosclerosis. No enlarged abdominal or pelvic lymph nodes. Reproductive: Prostate is unremarkable. Other: No free fluid or free gas. No abdominal wall hernia. Ventriculostomy catheter enters the abdomen in the right upper quadrant, tip within the right hemipelvis. Musculoskeletal: No acute or destructive bony lesions. Reconstructed images demonstrate no additional findings. IMPRESSION: 1. High-grade right-sided obstructive uropathy due to a 5 mm obstructing right UVJ calculus. 2. Nonobstructing 3 mm left renal calculus. 3. Indeterminate 2.1 cm hypodensity ventral left kidney, likely a cyst. This could be confirmed by nonemergent outpatient ultrasound if further imaging is desired. 4. Diverticulosis without diverticulitis. 5. Small hiatal hernia. 6.  Aortic Atherosclerosis (ICD10-I70.0). Electronically Signed   By: Randa Ngo M.D.   On: 12/14/2020 00:15    Assessment/Plan: 60 year old man who presented to the ED with increased urinary frequency and fever found to have a 5 mm obstructing right UVJ calculus now postop day 1 status post right ureteral stent  placement.  -will arrange follow up to schedule definitive stone management in [place] -pt to continue antibiotics pending urine cx -tamsulosin for stent discomfort   Jacalyn Lefevre, MD Urology 12/14/2020, 11:42 AM

## 2020-12-14 NOTE — Consult Note (Signed)
I have been asked to see the patient by Dr. Christy Gentles, for evaluation and management of right ureteral calculus with infections.  History of present illness: 60 yo man presented to [facility] ED after his mom noticed foul-smelling urine, increased urinary frequency and fever 100.2.  Patient has hx of anoxic brain injury and HTN.  He denies any pain at the moment.     Review of systems: A 12 point comprehensive review of systems was obtained and is negative unless otherwise stated in the history of present illness.  Patient Active Problem List   Diagnosis Date Noted  . Mass of shoulder region 09/12/2013  . Dysphagia 12/20/2012  . GERD (gastroesophageal reflux disease) 12/20/2012    No current facility-administered medications on file prior to encounter.   Current Outpatient Medications on File Prior to Encounter  Medication Sig Dispense Refill  . acetaminophen (TYLENOL) 500 MG tablet Take 500 mg by mouth every 6 (six) hours as needed for mild pain or moderate pain.    Marland Kitchen aspirin 81 MG tablet Take 81 mg by mouth daily.    Marland Kitchen atorvastatin (LIPITOR) 40 MG tablet Take 40 mg by mouth at bedtime.     Marland Kitchen esomeprazole (NEXIUM) 40 MG capsule Take 40 mg by mouth daily at 12 noon.    . fenofibrate (TRICOR) 145 MG tablet Take 145 mg by mouth daily.     . hydrochlorothiazide (HYDRODIURIL) 25 MG tablet Take 50 mg by mouth daily.     . meclizine (ANTIVERT) 25 MG tablet Take 25 mg by mouth daily as needed for dizziness.     . Multiple Vitamin (MULTIVITAMIN) capsule Take 1 capsule by mouth daily.    . potassium chloride (K-DUR) 10 MEQ tablet Take 10 mEq by mouth daily.     . Vitamin D, Ergocalciferol, (DRISDOL) 50000 units CAPS capsule Take 50,000 Units by mouth every 7 (seven) days.      Past Medical History:  Diagnosis Date  . Blind   . GERD (gastroesophageal reflux disease)   . Hyperlipidemia   . Hypertension   . Hypoxic brain injury ([place])   . Muscle spasms of head and/or neck    and overall body   . Obesity     Past Surgical History:  Procedure Laterality Date  . CHOLECYSTECTOMY    . ESOPHAGOGASTRODUODENOSCOPY N/A 01/04/2013   CMK:LKJZP hiatal hernia/GASTRIC PolypS/MODERATE Non-erosive gastritis/SMALL nodule in the duodenal bulb  . high pressure shunt    . MALONEY DILATION N/A 01/04/2013   Procedure: MALONEY DILATION;  Surgeon: Danie Binder, MD;  Location[facility] ENDO [facility]ITE;  Service: Endoscopy;  Laterality: N/A;  . MASS EXCISION Left 09/12/2013   Procedure: EXCISION NEOPLASM LEFT SHOULDER;  Surgeon: Scherry Ran, MD;  Location: [facility] ORS;  Service: General;  Laterality: Left;  . SAVORY DILATION N/A 01/04/2013   Procedure: SAVORY DILATION;  Surgeon: Danie Binder, MD;  Location: [facility] END[facility];  Service: Endoscopy;  Laterality: N/A;    Social History   Tobacco Use  . Smoking status: Never Smoker  . Smokeless tobacco: Never Used  Substance Use Topics  . Alcohol use: No  . Drug use: No    Family History  Problem Relation Age of Onset  . Breast cancer Mother   . Kidney failure Father   . Breast cancer Sister     PE: Vitals:   12/13/20 2300 12/14/20 0141 12/14/20 0200 12/14/20 0230  BP: 135/73 108/71 (!) 113/59 (!) 105/53  Pulse: 68 68 67 (!) 59  Resp: 20 20    Temp:  98.2 F (36.8 C)    TempSrc:  Oral    SpO2: 97% 95% 95% 93%   Patient appears to be in no acute distress  patient is alert and oriented x3 Atraumatic normocephalic head No increased work of breathing, no audible wheezes/rhonchi Abdomen is soft, nontender, nondistended Lower extremities are symmetric without appreciable edema Grossly neurologically intact No identifiable skin lesions  Recent Labs    12/13/20 2125  WBC 13.0*  HGB 13.5  HCT 40.8   Recent Labs    12/13/20 2125  NA 137  K 3.0*  CL 99  CO2 27  GLUCOSE 156*  BUN 20  CREATININE 0.75  CALCIUM 9.2   No results for input(s): LABPT, INR in the last 72 hours. No results for input(s): LABURIN in the last 72 hours. Results for  orders placed or performed during the hospital encounter of 12/13/20  Blood culture (routine x 2)     Status: None (Preliminary result)   Collection Time: 12/13/20  9:11 PM   Specimen: Right Antecubital; Blood  Result Value Ref Range Status   Specimen Description RIGHT ANTECUBITAL  Final   Special Requests   Final    BOTTLES DRAWN AEROBIC AND ANAEROBIC Blood Culture adequate volume Performed at [facility], [address], [place], [place] [address]    Culture PENDING  Incomplete   Report Status PENDING  Incomplete  Blood culture (routine x 2)     Status: None (Preliminary result)   Collection Time: 12/13/20 11:18 PM   Specimen: Left Antecubital; Blood  Result Value Ref Range Status   Specimen Description LEFT ANTECUBITAL  Final   Special Requests   Final    BOTTLES DRAWN AEROBIC AND ANAEROBIC Blood Culture adequate volume Performed at [facility], [address], [place], [place] [address]    Culture PENDING  Incomplete   Report Status PENDING  Incomplete  Resp Panel by RT-PCR (Flu A&B, Covid) Nasopharyngeal Swab     Status: None   Collection Time: 12/14/20 12:44 AM   Specimen: Nasopharyngeal Swab; Nasopharyngeal(NP) swabs in vial transport medium  Result Value Ref Range Status   SARS Coronavirus 2 by RT PCR NEGATIVE NEGATIVE Final    Comment: (NOTE) SARS-CoV-2 target nucleic acids are NOT DETECTED.  The SARS-CoV-2 RNA is generally detectable in upper respiratory specimens during the acute phase of infection. The lowest concentration of SARS-CoV-2 viral copies this assay can detect is 138 copies/mL. A negative result does not preclude SARS-Cov-2 infection and should not be used as the sole basis for treatment or other patient management decisions. A negative result may occur with  improper specimen collection/handling, submission of specimen other than nasopharyngeal swab, presence of viral mutation(s) within the areas targeted by this assay, and inadequate number of  viral copies(<138 copies/mL). A negative result must be combined with clinical observations, patient history, and epidemiological information. The expected result is Negative.  Fact Sheet for Patients:  EntrepreneurPulse.com.au  Fact Sheet for Healthcare Providers:  IncredibleEmployment.be  This test is no t yet approved or cleared by the [place] FDA and  has been authorized for detection and/or diagnosis of SARS-CoV-2 by FDA under an Emergency Use Authorization (EUA). This EUA will remain  in effect (meaning this test can be used) for the duration of the COVID-19 declaration under Section 564(b)(1) of the Act, 21 U.S.C.section 360bbb-3(b)(1), unless the authorization is terminated  or revoked sooner.       Influenza A by PCR  NEGATIVE NEGATIVE Final   Influenza B by PCR NEGATIVE NEGATIVE Final    Comment: (NOTE) The Xpert Xpress SARS-CoV-2/FLU/RSV plus assay is intended as an aid in the diagnosis of influenza from Nasopharyngeal swab specimens and should not be used as a sole basis for treatment. Nasal washings and aspirates are unacceptable for Xpert Xpress SARS-CoV-2/FLU/RSV testing.  Fact Sheet for Patients: EntrepreneurPulse.com.au  Fact Sheet for Healthcare Providers: IncredibleEmployment.be  This test is not yet approved or cleared by the [place] FDA and has been authorized for detection and/or diagnosis of SARS-CoV-2 by FDA under an Emergency Use Authorization (EUA). This EUA will remain in effect (meaning this test can be used) for the duration of the COVID-19 declaration under Section 564(b)(1) of the Act, 21 U.S.C. section 360bbb-3(b)(1), unless the authorization is terminated or revoked.  Performed at [facility], [address] [place], [place] [address]     Imaging: CT Abd/Pelvis 12/14/20 IMPRESSION: 1. HIMPRESSION: 1. High-grade right-sided obstructive uropathy due to a  5 mm obstructing right UVJ calculus. 2. Nonobstructing 3 mm left renal calculus. 3. Indeterminate 2.1 cm hypodensity ventral left kidney, likely a cyst. This could be confirmed by nonemergent outpatient ultrasound if further imaging is desired. 4. Diverticulosis without diverticulitis. 5. Small hiatal hernia. 6.  Aortic Atherosclerosis (ICD10-I70.0).igh-grade right-sided obstructive uropathy due to a 5 mm obstructing right UVJ calculus. 2. Nonobstructing 3 mm left renal calculus. 3. Indeterminate 2.1 cm hypodensity ventral left kidney, likely a cyst. This could be confirmed by nonemergent outpatient ultrasound if further imaging is desired. 4. Diverticulosis without diverticulitis. 5. Small hiatal hernia. 6.  Aortic Atherosclerosis (ICD10-I70.0).   Imp/Recommendations: 60 yo man who presented with fever and increased urinary frequency found have 54m right UVJ calculus and UA consistent with infection.  -Discussed the risks and benefits of cystoscopy with right ureteral stent placement with patient and patient's mother -Risks include but are not limited to bleeding, infection, damage surrounding structures, need for future intervention, inability to place stent, pain -Patient will be taken to the operating room for stent placement today -He will need definitive removal of the stone scheduled in 2 to 3 weeks.   Thank you for involving me in this patient's care.  Please page with any further questions or concerns. MARYELLEN D PACE

## 2020-12-14 NOTE — ED Provider Notes (Signed)
I have discussed the case with Dr. Arnette Schaumann with urology Plan will be to transfer to the [facility] long hospital.  From there he will be taken to the operating room for likely ureteral stent placement.  Endorsed the case to Dr. Deno Etienne at [facility] in the emergency department.  Patient will need to be admitted after his procedure.  Consultation to [facility] for admission will be deferred so as not to delay the urologic procedure  Patient and his mother updated on plan  CRITICAL CARE Performed by: Sharyon Cable Total critical care time: 35 minutes Critical care time was exclusive of separately billable procedures and treating other patients. Critical care was necessary to treat or prevent imminent or life-threatening deterioration. Critical care was time spent personally by me on the following activities: development of treatment plan with patient and/or surrogate as well as nursing, discussions with consultants, evaluation of patient's response to treatment, examination of patient, obtaining history from patient or surrogate, ordering and performing treatments and interventions, ordering and review of laboratory studies, ordering and review of radiographic studies, pulse oximetry and re-evaluation of patient's condition.    Ripley Fraise, MD 12/14/20 [phone]

## 2020-12-14 NOTE — Op Note (Signed)
Preoperative diagnosis:  1. Right obstructing ureteral calculus with UTI  Postoperative diagnosis:  1. Right obstructing ureteral calculus with UTI  Procedure:  1. Cystoscopy 2. right ureteral stent placement (6Fr x 26 cm no tether) 3. right retrograde pyelography with interpretation   Surgeon: Jacalyn Lefevre, MD  Anesthesia: General  Complications: None  Intraoperative findings:  right retrograde pyelography demonstrated hydronephrosis to level of the bladder consistent with the patient's known calculus without other abnormalities.  EBL: Minimal  Specimens: None  Indication: [name] is a 60 y.o. patient with 5 mm obstructing right UVJ calculus associated with fever and urinalysis consistent with infection. After reviewing the management options for treatment, he elected to proceed with the above surgical procedure(s). We have discussed the potential benefits and risks of the procedure, side effects of the proposed treatment, the likelihood of the patient achieving the goals of the procedure, and any potential problems that might occur during the procedure or recuperation. Informed consent has been obtained.  Description of procedure:  The patient was taken to the operating room and general anesthesia was induced.  The patient was placed in the dorsal lithotomy position, prepped and draped in the usual sterile fashion, and preoperative antibiotics were administered. A preoperative time-out was performed.   Cystourethroscopy was performed.  The patient's urethra was examined and was normal  demonstrated bilobar prostatic hypertrophy. The bladder was then systematically examined in its entirety. There was no evidence for any bladder tumors, stones, or other mucosal pathology.    Attention then turned to the rightureteral orifice and a ureteral catheter was used to intubate the ureteral orifice.  Omnipaque contrast was injected through the ureteral catheter and a retrograde  pyelogram was performed with findings as dictated above.  A 0.38 sensor guidewire was then advanced up the right ureter into the renal pelvis under fluoroscopic guidance.  The wire was then backloaded through the cystoscope and a ureteral stent was advance over the wire using Seldinger technique.  The stent was positioned appropriately under fluoroscopic and cystoscopic guidance.  The wire was then removed with an adequate stent curl noted in the renal pelvis as well as in the bladder.  The bladder was then emptied and the procedure ended.  The patient appeared to tolerate the procedure well and without complications.  The patient was able to be awakened and transferred to the recovery unit in satisfactory condition.   Follow up: The patient will be admitted for observation given fevers and co-morbidities.  He will need definitive stone management in 2-3 weeks.    Jacalyn Lefevre, M.D.

## 2020-12-14 NOTE — Anesthesia Preprocedure Evaluation (Addendum)
Anesthesia Evaluation  Patient identified by MRN, date of birth, ID band Patient awake    Reviewed: Allergy & Precautions, NPO status , Patient's Chart, lab work & pertinent test results  History of Anesthesia Complications Negative for: history of anesthetic complications  Airway Mallampati: III  TM Distance: >3 FB Neck ROM: Full    Dental  (+) Dental Advisory Given, Poor Dentition, Teeth Intact   Pulmonary neg shortness of breath, neg sleep apnea, neg COPD, neg recent URI,  Covid-19 Nucleic Acid Test Results Lab Results      Component                Value               Date                      [place]              NEGATIVE            12/14/2020              breath sounds clear to auscultation       Cardiovascular hypertension, Pt. on medications  Rhythm:Regular     Neuro/Psych VP shunt in place, h/o anoxic brain injury, descent historian  Neuromuscular disease negative psych ROS   GI/Hepatic GERD  Medicated,Lab Results      Component                Value               Date                      ALT                      287 (H)             05/07/2009                AST                      179 (H)             05/07/2009                ALKPHOS                  132 (H)             05/07/2009                BILITOT                  1.1                 05/07/2009              Endo/Other  No results found for: HGBA1C   Glucose 156, no reported history of diabetes  Renal/GU Renal diseaseLab Results      Component                Value               Date                      CREATININE               0.75  12/13/2020           Lab Results      Component                Value               Date                      K                        3.0 (L)             12/13/2020                Musculoskeletal   Abdominal   Peds  Hematology Lab Results      Component                Value               Date                       WBC                      13.0 (H)            12/13/2020                HGB                      13.5                12/13/2020                HCT                      40.8                12/13/2020                MCV                      84.6                12/13/2020                PLT                      298                 12/13/2020              Anesthesia Other Findings Diffuse dental decay without loose teeth  Reproductive/Obstetrics                            Anesthesia Physical Anesthesia Plan  ASA: II  Anesthesia Plan: General   Post-op Pain Management:    Induction: Intravenous  PONV Risk Score and Plan: 2 and Ondansetron and Dexamethasone  Airway Management Planned: Oral ETT and LMA  Additional Equipment: None  Intra-op Plan:   Post-operative Plan: Extubation in OR  Informed Consent: I have reviewed the patients History and Physical, chart, labs and discussed the procedure including the risks, benefits and alternatives for the proposed anesthesia with the patient or authorized representative who has indicated his/her understanding and acceptance.     Dental advisory given  Plan Discussed with: CRNA and Surgeon  Anesthesia Plan Comments:         Anesthesia Quick Evaluation

## 2020-12-14 NOTE — Progress Notes (Signed)
Pt admitted from [facility] by [facility] Changed to gown,

## 2020-12-14 NOTE — Plan of Care (Signed)
  Problem: Education: Goal: Knowledge of General Education information will improve Description: Including pain rating scale, medication(s)/side effects and non-pharmacologic comfort measures 12/14/2020 1009 by Talbert Forest, RN Outcome: Progressing 12/14/2020 0832 by Talbert Forest, RN Outcome: Progressing   Problem: Health Behavior/Discharge Planning: Goal: Ability to manage health-related needs will improve 12/14/2020 1009 by Talbert Forest, RN Outcome: Progressing 12/14/2020 0832 by Talbert Forest, RN Outcome: Progressing   Problem: Clinical Measurements: Goal: Ability to maintain clinical measurements within normal limits will improve 12/14/2020 1009 by Talbert Forest, RN Outcome: Progressing 12/14/2020 0832 by Talbert Forest, RN Outcome: Progressing Goal: Will remain free from infection 12/14/2020 1009 by Talbert Forest, RN Outcome: Progressing 12/14/2020 0832 by Talbert Forest, RN Outcome: Progressing Goal: Diagnostic test results will improve 12/14/2020 1009 by Talbert Forest, RN Outcome: Progressing 12/14/2020 0832 by Talbert Forest, RN Outcome: Progressing Goal: Respiratory complications will improve 12/14/2020 1009 by Talbert Forest, RN Outcome: Progressing 12/14/2020 0832 by Talbert Forest, RN Outcome: Progressing Goal: Cardiovascular complication will be avoided 12/14/2020 1009 by Talbert Forest, RN Outcome: Progressing 12/14/2020 0832 by Talbert Forest, RN Outcome: Progressing   Problem: Nutrition: Goal: Adequate nutrition will be maintained 12/14/2020 1009 by Talbert Forest, RN Outcome: Progressing 12/14/2020 0832 by Talbert Forest, RN Outcome: Progressing   Problem: Coping: Goal: Level of anxiety will decrease 12/14/2020 1009 by Talbert Forest, RN Outcome: Progressing 12/14/2020 0832 by Talbert Forest, RN Outcome: Progressing   Problem: Elimination: Goal:  Will not experience complications related to bowel motility Outcome: Progressing Goal: Will not experience complications related to urinary retention 12/14/2020 1009 by Talbert Forest, RN Outcome: Progressing 12/14/2020 0832 by Talbert Forest, RN Outcome: Progressing   Problem: Pain Managment: Goal: General experience of comfort will improve 12/14/2020 1009 by Talbert Forest, RN Outcome: Progressing 12/14/2020 0832 by Talbert Forest, RN Outcome: Progressing   Problem: Safety: Goal: Ability to remain free from injury will improve 12/14/2020 1009 by Talbert Forest, RN Outcome: Progressing 12/14/2020 0832 by Talbert Forest, RN Outcome: Progressing   Problem: Skin Integrity: Goal: Risk for impaired skin integrity will decrease 12/14/2020 1009 by Talbert Forest, RN Outcome: Progressing 12/14/2020 0832 by Talbert Forest, RN Outcome: Progressing

## 2020-12-14 NOTE — ED Notes (Signed)
Report given to [place] from [place].  Report given to Estill Bamberg RN at [facility] ED.

## 2020-12-14 NOTE — Progress Notes (Signed)
Pt went to OR.

## 2020-12-14 NOTE — Anesthesia Procedure Notes (Signed)
Procedure Name: LMA Insertion Date/Time: 12/14/2020 4:18 AM Performed by: Lissa Morales, CRNA Pre-anesthesia Checklist: Patient identified, Emergency Drugs available, Suction available and Patient being monitored Patient Re-evaluated:Patient Re-evaluated prior to induction Oxygen Delivery Method: Circle system utilized Preoxygenation: Pre-oxygenation with 100% oxygen Induction Type: IV induction Ventilation: Mask ventilation without difficulty LMA: LMA with gastric port inserted LMA Size: 5.0 Tube type: Oral Number of attempts: 1 Airway Equipment and Method: Oral airway Placement Confirmation: positive ETCO2 Tube secured with: Tape Dental Injury: Teeth and Oropharynx as per pre-operative assessment

## 2020-12-15 ENCOUNTER — Encounter (HOSPITAL_COMMUNITY): Payer: Self-pay | Admitting: Urology

## 2020-12-15 DIAGNOSIS — I10 Essential (primary) hypertension: Secondary | ICD-10-CM

## 2020-12-15 DIAGNOSIS — N201 Calculus of ureter: Secondary | ICD-10-CM

## 2020-12-15 DIAGNOSIS — N135 Crossing vessel and stricture of ureter without hydronephrosis: Secondary | ICD-10-CM | POA: Diagnosis not present

## 2020-12-15 LAB — BASIC METABOLIC PANEL
Anion gap: 10 (ref 5–15)
BUN: 13 mg/dL (ref 6–20)
CO2: 27 mmol/L (ref 22–32)
Calcium: 8.8 mg/dL — ABNORMAL LOW (ref 8.9–10.3)
Chloride: 103 mmol/L (ref 98–111)
Creatinine, Ser: 0.7 mg/dL (ref 0.61–1.24)
GFR, Estimated: >60 mL/min (ref >60)
Glucose, Bld: 177 mg/dL — ABNORMAL HIGH (ref 70–99)
Potassium: 3.5 mmol/L (ref 3.5–5.1)
Sodium: 140 mmol/L (ref 135–145)

## 2020-12-15 LAB — CBC
HCT: 37.5 % — ABNORMAL LOW (ref 39.0–52.0)
Hemoglobin: 12.3 g/dL — ABNORMAL LOW (ref 13.0–17.0)
MCH: 28.1 pg (ref 26.0–34.0)
MCHC: 32.8 g/dL (ref 30.0–36.0)
MCV: 85.8 fL (ref 80.0–100.0)
Platelets: 233 10*3/uL (ref 150–400)
RBC: 4.37 MIL/uL (ref 4.22–5.81)
RDW: 15.4 % (ref 11.5–15.5)
WBC: 8.3 10*3/uL (ref 4.0–10.5)
nRBC: 0 % (ref 0.0–0.2)

## 2020-12-15 MED ORDER — SODIUM CHLORIDE 0.9 % IV SOLN
1.0000 g | INTRAVENOUS | Status: DC
  Administered 2020-12-15: 1 g via INTRAVENOUS
  Filled 2020-12-15: qty 1

## 2020-12-15 MED ORDER — CEPHALEXIN 500 MG PO CAPS: 500.0000 mg | ORAL_CAPSULE | Freq: Four times a day (QID) | ORAL | 0 refills | Status: DC

## 2020-12-15 MED ORDER — CEPHALEXIN 500 MG PO CAPS: 500.0000 mg | ORAL_CAPSULE | Freq: Four times a day (QID) | ORAL | 0 refills | Status: AC

## 2020-12-15 MED ORDER — POTASSIUM CHLORIDE CRYS ER 20 MEQ PO TBCR
40.0000 meq | EXTENDED_RELEASE_TABLET | Freq: Once | ORAL | Status: AC
  Administered 2020-12-15: 40 meq via ORAL
  Filled 2020-12-15: qty 2

## 2020-12-15 NOTE — Plan of Care (Signed)

## 2020-12-15 NOTE — Discharge Instructions (Signed)

## 2020-12-15 NOTE — Discharge Summary (Signed)
Physician Discharge Summary  [name] WUJ:[id number] DOB: 05-02-1961  PCP: [facility]  Admitted from: Assisted living facility Discharged to: Assisted living facility  Admit date: 12/13/2020 Discharge date: 12/15/2020  Recommendations for Outpatient Follow-up:    Follow-up Information    [facility]. Schedule an appointment as soon as possible for a visit in 1 week(s).   Why: To be seen with repeat labs (CBC & BMP).   Contact information: [address] [place] [address] [phone]        Robley Fries, MD Follow up.   Specialty: Urology Why: MDs office will arrange follow-up in the [facility] office. Contact information: [address] [place] [address] [phone]                Home Health: Home    Equipment/Devices: Home    Discharge Condition: Improved and stable.   Code Status: Full Code Diet recommendation:  Discharge Diet Orders (From admission, onward)    Start     Ordered   12/15/20 0000  Diet - low sodium heart healthy        12/15/20 1029           Discharge Diagnoses:  Principal Problem:   Ureterovesical junction (UVJ) obstruction Active Problems:   GERD (gastroesophageal reflux disease)   Ureteral stone   Pyelonephritis   Essential hypertension   Brief Summary: 60 year old male, resident of an assisted living facility, moves around with help of a wheelchair, medical history significant for and not limited to anoxic brain injury, hydrocephalus s/p shunt, legal blindness, hypertension, hyperlipidemia, GERD, obesity, developed right flank pain x1 week which acutely worsened 24 hours prior to admission with associated nausea and vomiting, initially brought to [facility] ED where noted to have fever of 100.2 F, leukocytosis, UA concerning for UTI and CT renal study showed right obstructing ureteral calculus.  Patient was transferred to [facility], urology consulted and s/p cystoscopy and right ureteral stent placement 3/14.  Improved   Assessment & Plan:   Right obstructing ureteral calculus with UTI/complicated UTI Urology was consulted.  S/p cystoscopy, right ureteral stent placement and retrograde pyelography.  Will complete 3 days of IV ceftriaxone.  Urine culture results pending and indicates "reincubated for better growth". Blood cultures x2: Negative to date.  Per Urology, he will need definitive stone management in 2 to 3 weeks.  Currently without Foley catheter.    Clinically improved.  Asymptomatic of back pain.  I communicated with the urology service and they have cleared him for discharge home and will arrange follow-up in the [place] office.  After discussing with them and pharmacy, transition to oral Keflex for additional 4 days to complete a total 7 days course.  Final urine culture results to be followed by PCP/urology as outpatient.  Hypokalemia Due to GI losses from nausea, vomiting and HCTZ use.  Replaced aggressively and improved.  Magnesium 1.8.  For now discontinued HCTZ until outpatient follow-up with PCP with repeat BMP.  Continue low-dose potassium supplements.  Essential hypertension All meds were temporarily held in the hospital.  Discontinued HCTZ as noted above.  Resumed amlodipine.  Unclear as to why he is on both Toprol-XL and propranolol as outpatient.  I communicated with pharmacy who reviewed his meds and were unable to tell either and neither could the patient or his mother at bedside.  For now discontinued propranolol and continued Toprol-XL until close outpatient  follow-up with his PCP.  Patient's mother was advised to discuss this with his PCP.  Hyperlipidemia Continue statins.  As per pharmacy review, it appears that patient was not taking fenofibrate and this was discontinued.  GERD PPI  Anoxic brain injury Mental status at baseline.  Indeterminate 2.1 cm hypodensity ventral  left kidney Seen on CT renal study 3/14.  Radiology indicate that this is likely a cyst.  Outpatient follow-up with urology and could consider nonemergent outpatient ultrasound.  Body mass index is 38.07 kg/m.      Consultants:   Urology  Procedures:   Cystoscopy, right ureteral stent placement and retrograde pyelography 3/14.   Discharge Instructions  Discharge Instructions    Call MD for:  difficulty breathing, headache or visual disturbances   Complete by: As directed    Call MD for:  extreme fatigue   Complete by: As directed    Call MD for:  persistant dizziness or light-headedness   Complete by: As directed    Call MD for:  persistant nausea and vomiting   Complete by: As directed    Call MD for:  severe uncontrolled pain   Complete by: As directed    Call MD for:  temperature >100.4   Complete by: As directed    Diet - low sodium heart healthy   Complete by: As directed    Increase activity slowly   Complete by: As directed    No wound care   Complete by: As directed        Medication List    STOP taking these medications   hydrochlorothiazide 25 MG tablet Commonly known as: HYDRODIURIL   propranolol 40 MG tablet Commonly known as: INDERAL     TAKE these medications   acetaminophen 500 MG tablet Commonly known as: TYLENOL Take 500 mg by mouth every 8 (eight) hours as needed for mild pain or moderate pain.   amLODipine 10 MG tablet Commonly known as: NORVASC Take 10 mg by mouth daily.   aspirin EC 81 MG tablet Take 81 mg by mouth daily. Swallow whole.   atorvastatin 40 MG tablet Commonly known as: LIPITOR Take 40 mg by mouth at bedtime.   cephALEXin 500 MG capsule Commonly known as: KEFLEX Take 1 capsule (500 mg total) by mouth 4 (four) times daily for 4 days. Start taking on: December 16, 2020   cholecalciferol 25 MCG (1000 UNIT) tablet Commonly known as: VITAMIN D3 Take 1,000 Units by mouth daily.   cyclobenzaprine 5 MG  tablet Commonly known as: FLEXERIL Take 5 mg by mouth 3 (three) times daily as needed for muscle spasms.   escitalopram 10 MG tablet Commonly known as: LEXAPRO Take 10 mg by mouth at bedtime.   esomeprazole 40 MG capsule Commonly known as: NEXIUM Take 40 mg by mouth daily at 12 noon.   magnesium oxide 400 MG tablet Commonly known as: MAG-OX Take 400 mg by mouth daily.   meclizine 25 MG tablet Commonly known as: ANTIVERT Take 25 mg by mouth every 6 (six) hours as needed for dizziness.   metoprolol succinate 25 MG 24 hr tablet Commonly known as: TOPROL-XL Take 25 mg by mouth daily.   potassium chloride 10 MEQ tablet Commonly known as: KLOR-CON Take 10 mEq by mouth daily.   riboflavin 100 MG Tabs tablet Commonly known as: VITAMIN B-2 Take 400 mg by mouth daily.   SUMAtriptan 50 MG tablet Commonly known as: IMITREX Take 100 mg by mouth daily as needed for migraine. May  repeat in 2 hours if headache persists or recurs. Max 2 doses in 24 hours      Allergies  Allergen Reactions  . Lisinopril     Other reaction(s): "made me loopy"  . Penicillins Hives      Procedures/Studies: DG Chest Port 1 View  Result Date: 12/14/2020 CLINICAL DATA:  Fever, urinary frequency EXAM: PORTABLE CHEST 1 VIEW COMPARISON:  05/07/2019 FINDINGS: Single frontal view of the chest demonstrates an unremarkable cardiac silhouette. Small hiatal hernia. No airspace disease, effusion, or pneumothorax. Ventriculostomy catheter tubing overlies right chest. IMPRESSION: 1. No acute intrathoracic process. 2. Hiatal hernia. Electronically Signed   By: Randa Ngo M.D.   On: 12/14/2020 00:16   DG C-Arm 1-60 Min-No Report  Result Date: 12/14/2020 Fluoroscopy was utilized by the requesting physician.  No radiographic interpretation.   CT Renal Stone Study  Result Date: 12/14/2020 CLINICAL DATA:  Right lower back pain, increased urinary frequency EXAM: CT ABDOMEN AND PELVIS WITHOUT CONTRAST TECHNIQUE:  Multidetector CT imaging of the abdomen and pelvis was performed following the standard protocol without IV contrast. COMPARISON:  None. FINDINGS: Lower chest: No acute pleural or parenchymal lung disease. Small hiatal hernia. Unenhanced CT was performed per clinician order. Lack of IV contrast limits sensitivity and specificity, especially for evaluation of abdominal/pelvic solid viscera. Hepatobiliary: No focal liver abnormality is seen. Status post cholecystectomy. No biliary dilatation. Pancreas: Unremarkable. No pancreatic ductal dilatation or surrounding inflammatory changes. Spleen: Normal in size without focal abnormality. Adrenals/Urinary Tract: There is high-grade right-sided obstructive uropathy due to an obstructing 5 mm right UVJ calculus, reference image 77/2. Nonobstructing 3 mm upper pole left renal calculus is noted. No left-sided obstructive uropathy. Indeterminate 2.1 cm hypodensity off the ventral aspect mid left kidney demonstrates Hounsfield attenuation of 27. 1.2 cm cyst is identified off the lower pole left kidney. The adrenals are unremarkable.  Bladder is grossly normal. Stomach/Bowel: No bowel obstruction or ileus. Normal appendix right lower quadrant. Scattered diverticulosis of the distal colon without diverticulitis. No bowel wall thickening or inflammatory change. Vascular/Lymphatic: Aortic atherosclerosis. No enlarged abdominal or pelvic lymph nodes. Reproductive: Prostate is unremarkable. Other: No free fluid or free gas. No abdominal wall hernia. Ventriculostomy catheter enters the abdomen in the right upper quadrant, tip within the right hemipelvis. Musculoskeletal: No acute or destructive bony lesions. Reconstructed images demonstrate no additional findings. IMPRESSION: 1. High-grade right-sided obstructive uropathy due to a 5 mm obstructing right UVJ calculus. 2. Nonobstructing 3 mm left renal calculus. 3. Indeterminate 2.1 cm hypodensity ventral left kidney, likely a cyst. This  could be confirmed by nonemergent outpatient ultrasound if further imaging is desired. 4. Diverticulosis without diverticulitis. 5. Small hiatal hernia. 6.  Aortic Atherosclerosis (ICD10-I70.0). Electronically Signed   By: Randa Ngo M.D.   On: 12/14/2020 00:15      Subjective: Patient interviewed along with his mother and RN at bedside.  Denies complaints.  Occasional back spasms which is chronic and not new.  No hematuria.  No nausea or vomiting.  Tolerating diet.  Discharge Exam:  Vitals:   12/14/20 0928 12/14/20 1406 12/14/20 2008 12/15/20 0458  BP: 129/74 132/73 (!) 142/79 136/75  Pulse: (!) 57 (!) 58 63 62  Resp: 20 20 20 18   Temp: 98.3 F (36.8 C) 98.6 F (37 C) 98.6 F (37 C) 98.4 F (36.9 C)  TempSrc: Oral Oral Oral Oral  SpO2: 96% 94% 94% 93%  Weight:      Height:        General  exam: Young male, moderately built and obese lying comfortably supine in bed without distress.  Oral mucosa moist. Respiratory system: Clear to auscultation. Respiratory effort normal. Cardiovascular system: S1 & S2 heard, RRR. No JVD, murmurs, rubs, gallops or clicks. No pedal edema. Gastrointestinal system: Abdomen is nondistended, soft and nontender. No organomegaly or masses felt. Normal bowel sounds heard. Central nervous system: Alert and oriented. No focal neurological deficits.  Legally blind. Extremities: Symmetric 5 x 5 power. Skin: No rashes, lesions or ulcers Psychiatry: Judgement and insight may be somewhat impaired. Mood & affect appropriate.     The results of significant diagnostics from this hospitalization (including imaging, microbiology, ancillary and laboratory) are listed below for reference.     Microbiology: Recent Results (from the past 240 hour(s))  Blood culture (routine x 2)     Status: None (Preliminary result)   Collection Time: 12/13/20  9:11 PM   Specimen: Right Antecubital; Blood  Result Value Ref Range Status   Specimen Description RIGHT ANTECUBITAL   Final   Special Requests   Final    BOTTLES DRAWN AEROBIC AND ANAEROBIC Blood Culture adequate volume   Culture   Final    NO GROWTH 2 DAYS Performed at [facility], [address] [place], [place] [address]    Report Status PENDING  Incomplete  Blood culture (routine x 2)     Status: None (Preliminary result)   Collection Time: 12/13/20 11:18 PM   Specimen: Left Antecubital; Blood  Result Value Ref Range Status   Specimen Description LEFT ANTECUBITAL  Final   Special Requests   Final    BOTTLES DRAWN AEROBIC AND ANAEROBIC Blood Culture adequate volume   Culture   Final    NO GROWTH 2 DAYS Performed at [facility], [address] [place], [place] [address]    Report Status PENDING  Incomplete  Urine culture     Status: None (Preliminary result)   Collection Time: 12/14/20 12:35 AM   Specimen: Urine, Clean Catch  Result Value Ref Range Status   Specimen Description   Final    URINE, CLEAN CATCH Performed at [facility], [address] [place], [place] [address]    Special Requests   Final    NONE Performed at [facility], [address], [place], [place] [address]    Culture   Final    CULTURE REINCUBATED FOR BETTER GROWTH Performed at [facility], [place] [address] [place], [place] [address]    Report Status PENDING  Incomplete  Resp Panel by RT-PCR (Flu A&B, Covid) Nasopharyngeal Swab     Status: None   Collection Time: 12/14/20 12:44 AM   Specimen: Nasopharyngeal Swab; Nasopharyngeal(NP) swabs in vial transport medium  Result Value Ref Range Status   SARS Coronavirus 2 by RT PCR NEGATIVE NEGATIVE Final    Comment: (NOTE) SARS-CoV-2 target nucleic acids are NOT DETECTED.  The SARS-CoV-2 RNA is generally detectable in upper respiratory specimens during the acute phase of infection. The lowest concentration of SARS-CoV-2 viral copies this assay can detect is 138 copies/mL. A negative result does not preclude SARS-Cov-2 infection and should not be used as the  sole basis for treatment or other patient management decisions. A negative result may occur with  improper specimen collection/handling, submission of specimen other than nasopharyngeal swab, presence of viral mutation(s) within the areas targeted by this assay, and inadequate number of viral copies(<138 copies/mL). A negative result must be combined with clinical observations, patient history, and epidemiological information. The  expected result is Negative.  Fact Sheet for Patients:  EntrepreneurPulse.com.au  Fact Sheet for Healthcare Providers:  IncredibleEmployment.be  This test is no t yet approved or cleared by the [place] FDA and  has been authorized for detection and/or diagnosis of SARS-CoV-2 by FDA under an Emergency Use Authorization (EUA). This EUA will remain  in effect (meaning this test can be used) for the duration of the COVID-19 declaration under Section 564(b)(1) of the Act, 21 U.S.C.section 360bbb-3(b)(1), unless the authorization is terminated  or revoked sooner.       Influenza A by PCR NEGATIVE NEGATIVE Final   Influenza B by PCR NEGATIVE NEGATIVE Final    Comment: (NOTE) The Xpert Xpress SARS-CoV-2/FLU/RSV plus assay is intended as an aid in the diagnosis of influenza from Nasopharyngeal swab specimens and should not be used as a sole basis for treatment. Nasal washings and aspirates are unacceptable for Xpert Xpress SARS-CoV-2/FLU/RSV testing.  Fact Sheet for Patients: EntrepreneurPulse.com.au  Fact Sheet for Healthcare Providers: IncredibleEmployment.be  This test is not yet approved or cleared by the [place] FDA and has been authorized for detection and/or diagnosis of SARS-CoV-2 by FDA under an Emergency Use Authorization (EUA). This EUA will remain in effect (meaning this test can be used) for the duration of the COVID-19 declaration under Section 564(b)(1) of the  Act, 21 U.S.C. section 360bbb-3(b)(1), unless the authorization is terminated or revoked.  Performed at  [facility], [address], [place], [place] [address]      Labs: CBC: Recent Labs  Lab 12/13/20 2125 12/14/20 0658 12/15/20 0415  WBC 13.0* 10.5 8.3  NEUTROABS 9.1*  --   --   HGB 13.5 12.5* 12.3*  HCT 40.8 37.9* 37.5*  MCV 84.6 83.8 85.8  PLT 298 265 536    Basic Metabolic Panel: Recent Labs  Lab 12/13/20 2125 12/14/20 0658 12/14/20 1314 12/15/20 0415  NA 137 137 140 140  K 3.0* 2.9* 3.3* 3.5  CL 99 99 102 103  CO2 27 24 27 27   GLUCOSE 156* 155* 147* 177*  BUN 20 16 14 13   CREATININE 0.75 0.67 0.66 0.70  CALCIUM 9.2 8.6* 8.9 8.8*  MG  --  1.8  --   --     Urinalysis    Component Value Date/Time   COLORURINE YELLOW 12/13/2020 2121   APPEARANCEUR HAZY (A) 12/13/2020 2121   LABSPEC 1.012 12/13/2020 2121   PHURINE 7.0 12/13/2020 2121   GLUCOSEU NEGATIVE 12/13/2020 2121   HGBUR MODERATE (A) 12/13/2020 2121   BILIRUBINUR NEGATIVE 12/13/2020 2121   [place] NEGATIVE 12/13/2020 2121   PROTEINUR NEGATIVE 12/13/2020 2121   NITRITE NEGATIVE 12/13/2020 2121   LEUKOCYTESUR NEGATIVE 12/13/2020 2121    I discussed in detail with patient's mother at bedside, updated all care and answered questions.    Time coordinating discharge: 35 minutes  SIGNED:  Vernell Leep, MD, [place], [facility]. [facility]  To contact the attending provider between 7A-7P or the covering provider during after hours 7P-7A, please log into the web site [email or web address] and access using universal [place] password for that web site. If you do not have the password, please call the hospital operator.

## 2020-12-15 NOTE — NC FL2 (Signed)
[place] LEVEL OF CARE SCREENING TOOL     IDENTIFICATION  Patient Name: [name] Birthdate: 06/05/1961 Sex: male Admission Date (Current Location): 12/13/2020  [facility] and [place] Number:  Herbalist and Address:  [facility],  [place] [address], [place]      Provider Number: [id number]  Attending Physician Name and Address:  Modena Jansky, MD  Relative Name and Phone Number:  Jeannine Boga (Mother) [phone]    Current Level of Care: Hospital Recommended Level of Care: Other (Comment) (Group Home) Prior Approval Number:    Date Approved/Denied:   PASRR Number:    Discharge Plan: Other (Comment) (Group Home)    Current Diagnoses: Patient Active Problem List   Diagnosis Date Noted  . 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 Ureteral stone 12/14/2020  . Pyelonephritis 12/14/2020  . Essential hypertension 12/14/2020  . Mass of shoulder region 09/12/2013  . Dysphagia 12/20/2012  . GERD (gastroesophageal reflux disease) 12/20/2012    Orientation RESPIRATION BLADDER Height & Weight     Self,Time,Situation,Place  Normal Incontinent,External catheter Weight: 107 kg Height:  5\' 6"  (167.6 cm)  BEHAVIORAL SYMPTOMS/MOOD NEUROLOGICAL BOWEL NUTRITION STATUS      Incontinent Diet (Heart Healthy)  AMBULATORY STATUS COMMUNICATION OF NEEDS Skin   Extensive Assist Verbally Normal                       Personal Care Assistance Level of Assistance  Bathing,Feeding,Dressing Bathing Assistance: Maximum assistance Feeding assistance: Maximum assistance Dressing Assistance: Maximum assistance     Functional Limitations Info  Sight,Hearing,Speech (Blind) Sight Info: Impaired Hearing Info: Adequate Speech Info: Adequate    SPECIAL CARE FACTORS FREQUENCY  PT (By licensed PT),OT (By licensed OT)     PT Frequency: Eval and Treat OT Frequency: Eval and Treat            Contractures Contractures  Info: Present (bilateral arms)    Additional Factors Info  Code Status Code Status Info: Full             Current Medications (12/15/2020):  This is the current hospital active medication list Current Facility-Administered Medications  Medication Dose Route Frequency Provider Last Rate Last Admin  . acetaminophen (TYLENOL) tablet 650 mg  650 mg Oral Q6H PRN Rise Patience, MD   650 mg at 12/15/20 7673   Or  . acetaminophen (TYLENOL) suppository 650 mg  650 mg Rectal Q6H PRN Rise Patience, MD      . atorvastatin (LIPITOR) tablet 40 mg  40 mg Oral QHS Rise Patience, MD   40 mg at 12/14/20 2053  . cefTRIAXone (ROCEPHIN) 1 g in sodium chloride 0.9 % 100 mL IVPB  1 g Intravenous Q24H Leodis Sias T, RPH 200 mL/hr at 12/15/20 1152 1 g at 12/15/20 1152  . fenofibrate tablet 160 mg  160 mg Oral Daily Rise Patience, MD   160 mg at 12/15/20 0825  . heparin injection 5,000 Units  5,000 Units Subcutaneous Q8H Rise Patience, MD   5,000 Units at 12/15/20 4193  . hydrALAZINE (APRESOLINE) injection 10 mg  10 mg Intravenous Q4H PRN Rise Patience, MD      . meclizine (ANTIVERT) tablet 25 mg  25 mg Oral Daily PRN Rise Patience, MD      . pantoprazole (PROTONIX) EC tablet 40 mg  40 mg Oral Daily Rise Patience, MD   40 mg at 12/15/20 [id number]  Discharge Medications: Please see discharge summary for a list of discharge medications.  Relevant Imaging Results:  Relevant Lab Results:   Additional Information [id number]  Purcell Mouton, RN

## 2020-12-15 NOTE — TOC Progression Note (Signed)
Transition of Care [facility]) - Progression Note    Patient Details  Name: DAYMION NAZAIRE MRN: [id number] Date of Birth: [birth year]  Transition of [facility]) CM/SW Contact  Purcell Mouton, RN Phone Number: 12/15/2020, 2:53 PM  Clinical Narrative:     Pt will transport back to Group Home by niece and mother. [facility] was called to cancel. Group Home is aware.   Expected Discharge Plan: Group Home Barriers to Discharge: No Barriers Identified  Expected Discharge Plan and Services Expected Discharge Plan: Group Home       Living arrangements for the past 2 months: Group Home Expected Discharge Date: 12/15/20                                     Social Determinants of Health (SDOH) Interventions    Readmission Risk Interventions No flowsheet data found.

## 2020-12-16 LAB — CULTURE, BLOOD (ROUTINE X 2)
Culture: NO GROWTH
Culture: NO GROWTH
Special Requests: ADEQUATE

## 2020-12-16 LAB — URINE CULTURE: Culture: >=100000 — AB

## 2020-12-17 NOTE — Anesthesia Postprocedure Evaluation (Signed)
Anesthesia Post Note  Patient: [name]  Procedure(s) Performed: CYSTOSCOPY WITH RETROGRADE PYELOGRAM/URETERAL STENT PLACEMENT (Right Ureter)     Patient location during evaluation: PACU Anesthesia Type: General Level of consciousness: awake and alert Pain management: pain level controlled Vital Signs Assessment: post-procedure vital signs reviewed and stable Respiratory status: spontaneous breathing, nonlabored ventilation, respiratory function stable and patient connected to nasal cannula oxygen Cardiovascular status: blood pressure returned to baseline and stable Postop Assessment: no apparent nausea or vomiting Anesthetic complications: no   No complications documented.  Last Vitals:  Vitals:   12/15/20 0458 12/15/20 1329  BP: 136/75 (!) 152/82  Pulse: 62 63  Resp: 18 18  Temp: 36.9 C 36.8 C  SpO2: 93% 95%    Last Pain:  Vitals:   12/15/20 1329  TempSrc: Oral  PainSc:                  Riki Gehring

## 2020-12-18 LAB — CULTURE, BLOOD (ROUTINE X 2): Special Requests: ADEQUATE

## 2020-12-21 ENCOUNTER — Telehealth: Payer: Self-pay

## 2020-12-21 NOTE — Telephone Encounter (Signed)
Appointment scheduled with patient. Address given to patient as well.

## 2020-12-21 NOTE — Telephone Encounter (Signed)
-----   Message from Cleon Gustin, MD sent at 12/21/2020  8:31 AM EDT ----- Regarding: RE: follow up at [facility] I will forward the message to [facility] ----- Message ----- From: Robley Fries, MD Sent: 12/15/2020  11:01 AM EDT To: Cleon Gustin, MD Subject: RE: follow up at [place] the best way for me to get follow up for him?  ----- Message ----- From: Cleon Gustin, MD Sent: 12/15/2020  10:48 AM EDT To: Robley Fries, MD Subject: RE: follow up at [facility]                            No problem ----- Message ----- From: Robley Fries, MD Sent: 12/14/2020   3:28 PM EDT To: Cleon Gustin, MD Subject: follow up at [facility]                                I stent this patient this morning for obstructing stone with UTI.  He would like to get care in [place].  Would you mind treating his stone?

## 2021-01-05 ENCOUNTER — Ambulatory Visit (INDEPENDENT_AMBULATORY_CARE_PROVIDER_SITE_OTHER): Payer: Medicare Other | Admitting: Urology

## 2021-01-05 ENCOUNTER — Encounter: Payer: Self-pay | Admitting: Urology

## 2021-01-05 ENCOUNTER — Other Ambulatory Visit: Payer: Self-pay

## 2021-01-05 VITALS — BP 143/63 | HR 75 | Temp 98.6°F | Ht 66.0 in | Wt 219.0 lb

## 2021-01-05 DIAGNOSIS — N135 Crossing vessel and stricture of ureter without hydronephrosis: Secondary | ICD-10-CM

## 2021-01-05 MED ORDER — HYDROCODONE-ACETAMINOPHEN 5-325 MG PO TABS
1.0000 | ORAL_TABLET | Freq: Four times a day (QID) | ORAL | 0 refills | Status: AC | PRN
Start: 2021-01-05 — End: ?

## 2021-01-05 NOTE — Progress Notes (Signed)
01/05/2021 4:21 PM   [name] October 19, 1960 [id number]  Referring provider: [facility] [place],  [place] [address]  Right flank pain  HPI: [name] is a 60yo here for evaluation of a right ureteral calculus. He underwent right ureteral stent placement 12/14/2020 for a 80mm distal ureteral calculus with UTI. He is having right flank pain with the stent in place. He denies any worsening LUTS and no hematuria.    PMH: Past Medical History:  Diagnosis Date  . Blind   . GERD (gastroesophageal reflux disease)   . Hyperlipidemia   . Hypertension   . Hypoxic brain injury ([place])   . Muscle spasms of head and/or neck    and overall body  . Obesity     Surgical History: Past Surgical History:  Procedure Laterality Date  . CHOLECYSTECTOMY    . CYSTOSCOPY W/ URETERAL STENT PLACEMENT Right 12/14/2020   Procedure: CYSTOSCOPY WITH RETROGRADE PYELOGRAM/URETERAL STENT PLACEMENT;  Surgeon: Robley Fries, MD;  [facility]: [facility];  Service: Urology;  Laterality: Right;  . ESOPHAGOGASTRODUODENOSCOPY N/A 01/04/2013   XBJ:YNWGN hiatal hernia/GASTRIC PolypS/MODERATE Non-erosive gastritis/SMALL nodule in the duodenal bulb  . high pressure shunt    . MALONEY DILATION N/A 01/04/2013   Procedure: MALONEY DILATION;  Surgeon: Danie Binder, MD;  [facility];  Service: Endoscopy;  Laterality: N/A;  . MASS EXCISION Left 09/12/2013   Procedure: EXCISION NEOPLASM LEFT SHOULDER;  Surgeon: Scherry Ran, MD;  Location: [facility];  Service: General;  Laterality: Left;  . SAVORY DILATION N/A 01/04/2013   Procedure: SAVORY DILATION;  Surgeon: Danie Binder, MD;  [facility];  Service: Endoscopy;  Laterality: N/A;    Home Medications:  Allergies as of 01/05/2021      Reactions   Lisinopril    Other reaction(s): "made me loopy"   Penicillins Hives      Medication List       Accurate as of January 05, 2021  4:21 PM. If you have any questions, ask  your nurse or doctor.        acetaminophen 500 MG tablet Commonly known as: TYLENOL Take 500 mg by mouth every 8 (eight) hours as needed for mild pain or moderate pain.   amLODipine 10 MG tablet Commonly known as: NORVASC Take 10 mg by mouth daily.   aspirin EC 81 MG tablet Take 81 mg by mouth daily. Swallow whole.   atorvastatin 40 MG tablet Commonly known as: LIPITOR Take 40 mg by mouth at bedtime.   cholecalciferol 25 MCG (1000 UNIT) tablet Commonly known as: VITAMIN D3 Take 1,000 Units by mouth daily.   cyclobenzaprine 5 MG tablet Commonly known as: FLEXERIL Take 5 mg by mouth 3 (three) times daily as needed for muscle spasms.   escitalopram 10 MG tablet Commonly known as: LEXAPRO Take 10 mg by mouth at bedtime.   esomeprazole 40 MG capsule Commonly known as: NEXIUM Take 40 mg by mouth daily at 12 noon.   magnesium oxide 400 MG tablet Commonly known as: MAG-OX Take 400 mg by mouth daily.   meclizine 25 MG tablet Commonly known as: ANTIVERT Take 25 mg by mouth every 6 (six) hours as needed for dizziness.   metoprolol succinate 25 MG 24 hr tablet Commonly known as: TOPROL-XL Take 25 mg by mouth daily.   naproxen 500 MG tablet Commonly known as: NAPROSYN Take by mouth.   potassium chloride 10 MEQ tablet Commonly known as:  KLOR-CON Take 10 mEq by mouth daily.   propranolol 40 MG tablet Commonly known as: INDERAL Take by mouth.   riboflavin 100 MG Tabs tablet Commonly known as: VITAMIN B-2 Take 400 mg by mouth daily.   SUMAtriptan 50 MG tablet Commonly known as: IMITREX Take 100 mg by mouth daily as needed for migraine. May repeat in 2 hours if headache persists or recurs. Max 2 doses in 24 hours   SUMAtriptan 100 MG tablet Commonly known as: IMITREX Take by mouth.       Allergies:  Allergies  Allergen Reactions  . Lisinopril     Other reaction(s): "made me loopy"  . Penicillins Hives    Family History: Family History  Problem  Relation Age of Onset  . Breast cancer Mother   . Kidney failure Father   . Breast cancer Sister     Social History:  reports that he has never smoked. He has never used smokeless tobacco. He reports that he does not drink alcohol and does not use drugs.  ROS: All other review of systems were reviewed and are negative except what is noted above in HPI  Physical Exam: BP (!) 143/63   Pulse 75   Temp 98.6 F (37 C)   Ht 5\' 6"  (1.676 m)   Wt 219 lb (99.3 kg)   BMI 35.35 kg/m   Constitutional:  Alert and oriented, No acute distress. HEENT: [place] AT, moist mucus membranes.  Trachea midline, no masses. Cardiovascular: No clubbing, cyanosis, or edema. Respiratory: Normal respiratory effort, no increased work of breathing. GI: Abdomen is soft, nontender, nondistended, no abdominal masses GU: No CVA tenderness.  Lymph: No cervical or inguinal lymphadenopathy. Skin: No rashes, bruises or suspicious lesions. Neurologic: Grossly intact, no focal deficits, moving all 4 extremities. Psychiatric: Normal mood and affect.  Laboratory Data: Lab Results  Component Value Date   WBC 8.3 12/15/2020   HGB 12.3 (L) 12/15/2020   HCT 37.5 (L) 12/15/2020   MCV 85.8 12/15/2020   PLT 233 12/15/2020    Lab Results  Component Value Date   CREATININE 0.70 12/15/2020    No results found for: PSA  No results found for: TESTOSTERONE  No results found for: HGBA1C  Urinalysis    Component Value Date/Time   COLORURINE YELLOW 12/13/2020 2121   APPEARANCEUR HAZY (A) 12/13/2020 2121   LABSPEC 1.012 12/13/2020 2121   PHURINE 7.0 12/13/2020 2121   GLUCOSEU NEGATIVE 12/13/2020 2121   HGBUR MODERATE (A) 12/13/2020 2121   BILIRUBINUR NEGATIVE 12/13/2020 2121   [place] NEGATIVE 12/13/2020 2121   PROTEINUR NEGATIVE 12/13/2020 2121   NITRITE NEGATIVE 12/13/2020 2121   LEUKOCYTESUR NEGATIVE 12/13/2020 2121    Lab Results  Component Value Date   BACTERIA RARE (A) 12/13/2020    Pertinent  Imaging: CT 12/13/2020: Images reviewed and discussed with the patient No results found for this or any previous visit.  No results found for this or any previous visit.  No results found for this or any previous visit.  No results found for this or any previous visit.  No results found for this or any previous visit.  No results found for this or any previous visit.  No results found for this or any previous visit.  Results for orders placed during the hospital encounter of 12/13/20  CT Renal Stone Study  Narrative CLINICAL DATA:  Right lower back pain, increased urinary frequency  EXAM: CT ABDOMEN AND PELVIS WITHOUT CONTRAST  TECHNIQUE: Multidetector CT imaging of the abdomen  and pelvis was performed following the standard protocol without IV contrast.  COMPARISON:  None.  FINDINGS: Lower chest: No acute pleural or parenchymal lung disease. Small hiatal hernia.  Unenhanced CT was performed per clinician order. Lack of IV contrast limits sensitivity and specificity, especially for evaluation of abdominal/pelvic solid viscera.  Hepatobiliary: No focal liver abnormality is seen. Status post cholecystectomy. No biliary dilatation.  Pancreas: Unremarkable. No pancreatic ductal dilatation or surrounding inflammatory changes.  Spleen: Normal in size without focal abnormality.  Adrenals/Urinary Tract: There is high-grade right-sided obstructive uropathy due to an obstructing 5 mm right UVJ calculus, reference image 77/2.  Nonobstructing 3 mm upper pole left renal calculus is noted. No left-sided obstructive uropathy. Indeterminate 2.1 cm hypodensity off the ventral aspect mid left kidney demonstrates Hounsfield attenuation of 27. 1.2 cm cyst is identified off the lower pole left kidney.  The adrenals are unremarkable.  Bladder is grossly normal.  Stomach/Bowel: No bowel obstruction or ileus. Normal appendix right lower quadrant. Scattered diverticulosis of the  distal colon without diverticulitis. No bowel wall thickening or inflammatory change.  Vascular/Lymphatic: Aortic atherosclerosis. No enlarged abdominal or pelvic lymph nodes.  Reproductive: Prostate is unremarkable.  Other: No free fluid or free gas. No abdominal wall hernia. Ventriculostomy catheter enters the abdomen in the right upper quadrant, tip within the right hemipelvis.  Musculoskeletal: No acute or destructive bony lesions. Reconstructed images demonstrate no additional findings.  IMPRESSION: 1. High-grade right-sided obstructive uropathy due to a 5 mm obstructing right UVJ calculus. 2. Nonobstructing 3 mm left renal calculus. 3. Indeterminate 2.1 cm hypodensity ventral left kidney, likely a cyst. This could be confirmed by nonemergent outpatient ultrasound if further imaging is desired. 4. Diverticulosis without diverticulitis. 5. Small hiatal hernia. 6.  Aortic Atherosclerosis (ICD10-I70.0).   Electronically Signed By: Randa Ngo M.D. On: 12/14/2020 00:15   Assessment & Plan:    1. Ureterovesical junction (UVJ) obstruction -We discussed the management of kidney stones. These options include observation, ureteroscopy, shockwave lithotripsy (ESWL) and percutaneous nephrolithotomy (PCNL). We discussed which options are relevant to the patient's stone(s). We discussed the natural history of kidney stones as well as the complications of untreated stones and the impact on quality of life without treatment as well as with each of the above listed treatments. We also discussed the efficacy of each treatment in its ability to clear the stone burden. With any of these management options I discussed the signs and symptoms of infection and the need for emergent treatment should these be experienced. For each option we discussed the ability of each procedure to clear the patient of their stone burden.   For observation I described the risks which include but are not limited  to silent renal damage, life-threatening infection, need for emergent surgery, failure to pass stone and pain.   For ureteroscopy I described the risks which include bleeding, infection, damage to contiguous structures, positioning injury, ureteral stricture, ureteral avulsion, ureteral injury, need for prolonged ureteral stent, inability to perform ureteroscopy, need for an interval procedure, inability to clear stone burden, stent discomfort/pain, heart attack, stroke, pulmonary embolus and the inherent risks with general anesthesia.   For shockwave lithotripsy I described the risks which include arrhythmia, kidney contusion, kidney hemorrhage, need for transfusion, pain, inability to adequately break up stone, inability to pass stone fragments, Steinstrasse, infection associated with obstructing stones, need for alternate surgical procedure, need for repeat shockwave lithotripsy, MI, CVA, PE and the inherent risks with anesthesia/conscious sedation.   For PCNL I  described the risks including positioning injury, pneumothorax, hydrothorax, need for chest tube, inability to clear stone burden, renal laceration, arterial venous fistula or malformation, need for embolization of kidney, loss of kidney or renal function, need for repeat procedure, need for prolonged nephrostomy tube, ureteral avulsion, MI, CVA, PE and the inherent risks of general anesthesia.   - The patient would like to proceed with right ureteroscopic stone extraction - Urinalysis, Routine w reflex microscopic   No follow-ups on file.  Nicolette Bang, MD  [facility] [place]

## 2021-01-05 NOTE — H&P (View-Only) (Signed)
01/05/2021 4:21 PM   [name] [birth year] [id number]  Referring provider: [facility] [place],  [place] [address]  Right flank pain  HPI: [name] is a 60yo here for evaluation of a right ureteral calculus. He underwent right ureteral stent placement 12/14/2020 for a 85mm distal ureteral calculus with UTI. He is having right flank pain with the stent in place. He denies any worsening LUTS and no hematuria.    PMH: Past Medical History:  Diagnosis Date  . Blind   . GERD (gastroesophageal reflux disease)   . Hyperlipidemia   . Hypertension   . Hypoxic brain injury ([place])   . Muscle spasms of head and/or neck    and overall body  . Obesity     Surgical History: Past Surgical History:  Procedure Laterality Date  . CHOLECYSTECTOMY    . CYSTOSCOPY W/ URETERAL STENT PLACEMENT Right 12/14/2020   Procedure: CYSTOSCOPY WITH RETROGRADE PYELOGRAM/URETERAL STENT PLACEMENT;  Surgeon: Robley Fries, MD;  [facility];  Service: Urology;  Laterality: Right;  . ESOPHAGOGASTRODUODENOSCOPY N/A 01/04/2013   CHY:IFOYD hiatal hernia/GASTRIC PolypS/MODERATE Non-erosive gastritis/SMALL nodule in the duodenal bulb  . high pressure shunt    . MALONEY DILATION N/A 01/04/2013   Procedure: MALONEY DILATION;  Surgeon: Danie Binder, MD;  [facility];  Service: Endoscopy;  Laterality: N/A;  . MASS EXCISION Left 09/12/2013   Procedure: EXCISION NEOPLASM LEFT SHOULDER;  Surgeon: Scherry Ran, MD;  Location: [facility];  Service: General;  Laterality: Left;  . SAVORY DILATION N/A 01/04/2013   Procedure: SAVORY DILATION;  Surgeon: Danie Binder, MD;  [facility];  Service: Endoscopy;  Laterality: N/A;    Home Medications:  Allergies as of 01/05/2021      Reactions   Lisinopril    Other reaction(s): "made me loopy"   Penicillins Hives      Medication List       Accurate as of January 05, 2021  4:21 PM. If you have any questions, ask  your nurse or doctor.        acetaminophen 500 MG tablet Commonly known as: TYLENOL Take 500 mg by mouth every 8 (eight) hours as needed for mild pain or moderate pain.   amLODipine 10 MG tablet Commonly known as: NORVASC Take 10 mg by mouth daily.   aspirin EC 81 MG tablet Take 81 mg by mouth daily. Swallow whole.   atorvastatin 40 MG tablet Commonly known as: LIPITOR Take 40 mg by mouth at bedtime.   cholecalciferol 25 MCG (1000 UNIT) tablet Commonly known as: VITAMIN D3 Take 1,000 Units by mouth daily.   cyclobenzaprine 5 MG tablet Commonly known as: FLEXERIL Take 5 mg by mouth 3 (three) times daily as needed for muscle spasms.   escitalopram 10 MG tablet Commonly known as: LEXAPRO Take 10 mg by mouth at bedtime.   esomeprazole 40 MG capsule Commonly known as: NEXIUM Take 40 mg by mouth daily at 12 noon.   magnesium oxide 400 MG tablet Commonly known as: MAG-OX Take 400 mg by mouth daily.   meclizine 25 MG tablet Commonly known as: ANTIVERT Take 25 mg by mouth every 6 (six) hours as needed for dizziness.   metoprolol succinate 25 MG 24 hr tablet Commonly known as: TOPROL-XL Take 25 mg by mouth daily.   naproxen 500 MG tablet Commonly known as: NAPROSYN Take by mouth.   potassium chloride 10 MEQ tablet Commonly known as:  KLOR-CON Take 10 mEq by mouth daily.   propranolol 40 MG tablet Commonly known as: INDERAL Take by mouth.   riboflavin 100 MG Tabs tablet Commonly known as: VITAMIN B-2 Take 400 mg by mouth daily.   SUMAtriptan 50 MG tablet Commonly known as: IMITREX Take 100 mg by mouth daily as needed for migraine. May repeat in 2 hours if headache persists or recurs. Max 2 doses in 24 hours   SUMAtriptan 100 MG tablet Commonly known as: IMITREX Take by mouth.       Allergies:  Allergies  Allergen Reactions  . Lisinopril     Other reaction(s): "made me loopy"  . Penicillins Hives    Family History: Family History  Problem  Relation Age of Onset  . Breast cancer Mother   . Kidney failure Father   . Breast cancer Sister     Social History:  reports that he has never smoked. He has never used smokeless tobacco. He reports that he does not drink alcohol and does not use drugs.  ROS: All other review of systems were reviewed and are negative except what is noted above in HPI  Physical Exam: BP (!) 143/63   Pulse 75   Temp 98.6 F (37 C)   Ht 5\' 6"  (1.676 m)   Wt 219 lb (99.3 kg)   BMI 35.35 kg/m   Constitutional:  Alert and oriented, No acute distress. HEENT: [place] AT, moist mucus membranes.  Trachea midline, no masses. Cardiovascular: No clubbing, cyanosis, or edema. Respiratory: Normal respiratory effort, no increased work of breathing. GI: Abdomen is soft, nontender, nondistended, no abdominal masses GU: No CVA tenderness.  Lymph: No cervical or inguinal lymphadenopathy. Skin: No rashes, bruises or suspicious lesions. Neurologic: Grossly intact, no focal deficits, moving all 4 extremities. Psychiatric: Normal mood and affect.  Laboratory Data: Lab Results  Component Value Date   WBC 8.3 12/15/2020   HGB 12.3 (L) 12/15/2020   HCT 37.5 (L) 12/15/2020   MCV 85.8 12/15/2020   PLT 233 12/15/2020    Lab Results  Component Value Date   CREATININE 0.70 12/15/2020    No results found for: PSA  No results found for: TESTOSTERONE  No results found for: HGBA1C  Urinalysis    Component Value Date/Time   COLORURINE YELLOW 12/13/2020 2121   APPEARANCEUR HAZY (A) 12/13/2020 2121   LABSPEC 1.012 12/13/2020 2121   PHURINE 7.0 12/13/2020 2121   GLUCOSEU NEGATIVE 12/13/2020 2121   HGBUR MODERATE (A) 12/13/2020 2121   BILIRUBINUR NEGATIVE 12/13/2020 2121   [place] NEGATIVE 12/13/2020 2121   PROTEINUR NEGATIVE 12/13/2020 2121   NITRITE NEGATIVE 12/13/2020 2121   LEUKOCYTESUR NEGATIVE 12/13/2020 2121    Lab Results  Component Value Date   BACTERIA RARE (A) 12/13/2020    Pertinent  Imaging: CT 12/13/2020: Images reviewed and discussed with the patient No results found for this or any previous visit.  No results found for this or any previous visit.  No results found for this or any previous visit.  No results found for this or any previous visit.  No results found for this or any previous visit.  No results found for this or any previous visit.  No results found for this or any previous visit.  Results for orders placed during the hospital encounter of 12/13/20  CT Renal Stone Study  Narrative CLINICAL DATA:  Right lower back pain, increased urinary frequency  EXAM: CT ABDOMEN AND PELVIS WITHOUT CONTRAST  TECHNIQUE: Multidetector CT imaging of the abdomen  and pelvis was performed following the standard protocol without IV contrast.  COMPARISON:  None.  FINDINGS: Lower chest: No acute pleural or parenchymal lung disease. Small hiatal hernia.  Unenhanced CT was performed per clinician order. Lack of IV contrast limits sensitivity and specificity, especially for evaluation of abdominal/pelvic solid viscera.  Hepatobiliary: No focal liver abnormality is seen. Status post cholecystectomy. No biliary dilatation.  Pancreas: Unremarkable. No pancreatic ductal dilatation or surrounding inflammatory changes.  Spleen: Normal in size without focal abnormality.  Adrenals/Urinary Tract: There is high-grade right-sided obstructive uropathy due to an obstructing 5 mm right UVJ calculus, reference image 77/2.  Nonobstructing 3 mm upper pole left renal calculus is noted. No left-sided obstructive uropathy. Indeterminate 2.1 cm hypodensity off the ventral aspect mid left kidney demonstrates Hounsfield attenuation of 27. 1.2 cm cyst is identified off the lower pole left kidney.  The adrenals are unremarkable.  Bladder is grossly normal.  Stomach/Bowel: No bowel obstruction or ileus. Normal appendix right lower quadrant. Scattered diverticulosis of the  distal colon without diverticulitis. No bowel wall thickening or inflammatory change.  Vascular/Lymphatic: Aortic atherosclerosis. No enlarged abdominal or pelvic lymph nodes.  Reproductive: Prostate is unremarkable.  Other: No free fluid or free gas. No abdominal wall hernia. Ventriculostomy catheter enters the abdomen in the right upper quadrant, tip within the right hemipelvis.  Musculoskeletal: No acute or destructive bony lesions. Reconstructed images demonstrate no additional findings.  IMPRESSION: 1. High-grade right-sided obstructive uropathy due to a 5 mm obstructing right UVJ calculus. 2. Nonobstructing 3 mm left renal calculus. 3. Indeterminate 2.1 cm hypodensity ventral left kidney, likely a cyst. This could be confirmed by nonemergent outpatient ultrasound if further imaging is desired. 4. Diverticulosis without diverticulitis. 5. Small hiatal hernia. 6.  Aortic Atherosclerosis (ICD10-I70.0).   Electronically Signed By: Randa Ngo M.D. On: 12/14/2020 00:15   Assessment & Plan:    1. Ureterovesical junction (UVJ) obstruction -We discussed the management of kidney stones. These options include observation, ureteroscopy, shockwave lithotripsy (ESWL) and percutaneous nephrolithotomy (PCNL). We discussed which options are relevant to the patient's stone(s). We discussed the natural history of kidney stones as well as the complications of untreated stones and the impact on quality of life without treatment as well as with each of the above listed treatments. We also discussed the efficacy of each treatment in its ability to clear the stone burden. With any of these management options I discussed the signs and symptoms of infection and the need for emergent treatment should these be experienced. For each option we discussed the ability of each procedure to clear the patient of their stone burden.   For observation I described the risks which include but are not limited  to silent renal damage, life-threatening infection, need for emergent surgery, failure to pass stone and pain.   For ureteroscopy I described the risks which include bleeding, infection, damage to contiguous structures, positioning injury, ureteral stricture, ureteral avulsion, ureteral injury, need for prolonged ureteral stent, inability to perform ureteroscopy, need for an interval procedure, inability to clear stone burden, stent discomfort/pain, heart attack, stroke, pulmonary embolus and the inherent risks with general anesthesia.   For shockwave lithotripsy I described the risks which include arrhythmia, kidney contusion, kidney hemorrhage, need for transfusion, pain, inability to adequately break up stone, inability to pass stone fragments, Steinstrasse, infection associated with obstructing stones, need for alternate surgical procedure, need for repeat shockwave lithotripsy, MI, CVA, PE and the inherent risks with anesthesia/conscious sedation.   For PCNL I  described the risks including positioning injury, pneumothorax, hydrothorax, need for chest tube, inability to clear stone burden, renal laceration, arterial venous fistula or malformation, need for embolization of kidney, loss of kidney or renal function, need for repeat procedure, need for prolonged nephrostomy tube, ureteral avulsion, MI, CVA, PE and the inherent risks of general anesthesia.   - The patient would like to proceed with right ureteroscopic stone extraction - Urinalysis, Routine w reflex microscopic   No follow-ups on file.  Nicolette Bang, MD  [facility]rology [place]

## 2021-01-05 NOTE — Patient Instructions (Signed)

## 2021-01-05 NOTE — Progress Notes (Signed)
Urological Symptom Review  Patient is experiencing the following symptoms: Get up at night to urinate   Review of Systems  Gastrointestinal (upper)  : Negative for upper GI symptoms  Gastrointestinal (lower) : Negative for lower GI symptoms  Constitutional : Negative for symptoms  Skin: Negative for skin symptoms  Eyes: Negative for eye symptoms  Ear/Nose/Throat : Negative for Ear/Nose/Throat symptoms  Hematologic/Lymphatic: Negative for Hematologic/Lymphatic symptoms  Cardiovascular : Negative for cardiovascular symptoms  Respiratory : Negative for respiratory symptoms  Endocrine: Negative for endocrine symptoms  Musculoskeletal: Negative for musculoskeletal symptoms  Neurological: Headaches  Psychologic: Negative for psychiatric symptoms

## 2021-01-29 NOTE — Patient Instructions (Signed)
[name]  01/29/2021     @PREFPERIOPPHARMACY @   Your procedure is scheduled on 02/04/2021.    Report to [facility] at  1000  A.M.                       You will need someone with you while we wait on your COVID results    Call this number if you have problems the morning of surgery:  [phone]   Remember:  Do not eat or drink after midnight.                        Take these medicines the morning of surgery with A SIP OF WATER  Amlodipine, flexeril (if needed), nexium, hydrocodone (if needed), antivert (if needed), propranolol, imitrex (if needed).     Please shower before you come. Make sure you dry off with a clean towel, wear clean clothes and brush your teeth.     Do not wear jewelry, make-up or nail polish.  Do not wear lotions, powders, or perfumes, or deodorant.  Do not shave 48 hours prior to surgery.  Men may shave face and neck.  Do not bring valuables to the hospital.  [facility] is not responsible for any belongings or valuables.  Contacts, dentures or bridgework may not be worn into surgery.  Leave your suitcase in the car.  After surgery it may be brought to your room.  For patients admitted to the hospital, discharge time will be determined by your treatment team.  Patients discharged the day of surgery will not be allowed to drive home and must have someone with them for 24 hours.   Special instructions:  DO NOT smoke tobacco or vape for 24 hours before your procedure.  Please read over the following fact sheets that you were given. Anesthesia Post-op Instructions and Care and Recovery After Surgery       Ureteral Stent Implantation, Care After This sheet gives you information about how to care for yourself after your procedure. Your health care provider may also give you more specific instructions. If you have problems or questions, contact your health care provider. What can I expect after the procedure? After the procedure, it is  common to have:  Nausea.  Mild pain when you urinate. You may feel this pain in your lower back or lower abdomen. The pain should stop within a few minutes after you urinate. This may last for up to 1 week.  A small amount of blood in your urine for several days. Follow these instructions at home: Medicines  Take over-the-counter and prescription medicines only as told by your health care provider.  If you were prescribed an antibiotic medicine, take it as told by your health care provider. Do not stop taking the antibiotic even if you start to feel better.  Do not drive for 24 hours if you were given a sedative during your procedure.  Ask your health care provider if the medicine prescribed to you requires you to avoid driving or using heavy machinery. Activity  Rest as told by your health care provider.  Avoid sitting for a long time without moving. Get up to take short walks every 1-2 hours. This is important to improve blood flow and breathing. Ask for help if you feel weak or unsteady.  Return to your normal activities as told by your health care provider. Ask your health care  provider what activities are safe for you. General instructions  Watch for any blood in your urine. Call your health care provider if the amount of blood in your urine increases.  If you have a catheter: ? Follow instructions from your health care provider about taking care of your catheter and collection bag. ? Do not take baths, swim, or use a hot tub until your health care provider approves. Ask your health care provider if you may take showers. You may only be allowed to take sponge baths.  Drink enough fluid to keep your urine pale yellow.  Do not use any products that contain nicotine or tobacco, such as cigarettes, e-cigarettes, and chewing tobacco. These can delay healing after surgery. If you need help quitting, ask your health care provider.  Keep all follow-up visits as told by your health  care provider. This is important.   Contact a health care provider if:  You have pain that gets worse or does not get better with medicine, especially pain when you urinate.  You have difficulty urinating.  You feel nauseous or you vomit repeatedly during a period of more than 2 days after the procedure. Get help right away if:  Your urine is dark red or has blood clots in it.  You are leaking urine (have incontinence).  The end of the stent comes out of your urethra.  You cannot urinate.  You have sudden, sharp, or severe pain in your abdomen or lower back.  You have a fever.  You have swelling or pain in your legs.  You have difficulty breathing. Summary  After the procedure, it is common to have mild pain when you urinate that goes away within a few minutes after you urinate. This may last for up to 1 week.  Watch for any blood in your urine. Call your health care provider if the amount of blood in your urine increases.  Take over-the-counter and prescription medicines only as told by your health care provider.  Drink enough fluid to keep your urine pale yellow. This information is not intended to replace advice given to you by your health care provider. Make sure you discuss any questions you have with your health care provider. Document Revised: 06/26/2018 Document Reviewed: 06/27/2018 Elsevier Patient Education  2021 [place] Anesthesia, Adult, Care After This sheet gives you information about how to care for yourself after your procedure. Your health care provider may also give you more specific instructions. If you have problems or questions, contact your health care provider. What can I expect after the procedure? After the procedure, the following side effects are common:  Pain or discomfort at the IV site.  Nausea.  Vomiting.  Sore throat.  Trouble concentrating.  Feeling cold or chills.  Feeling weak or tired.  Sleepiness and  fatigue.  Soreness and body aches. These side effects can affect parts of the body that were not involved in surgery. Follow these instructions at home: For the time period you were told by your health care provider:  Rest.  Do not participate in activities where you could fall or become injured.  Do not drive or use machinery.  Do not drink alcohol.  Do not take sleeping pills or medicines that cause drowsiness.  Do not make important decisions or sign legal documents.  Do not take care of children on your own.   Eating and drinking  Follow any instructions from your health care provider about eating or drinking restrictions.  When  you feel hungry, start by eating small amounts of foods that are soft and easy to digest (bland), such as toast. Gradually return to your regular diet.  Drink enough fluid to keep your urine pale yellow.  If you vomit, rehydrate by drinking water, juice, or clear broth. General instructions  If you have sleep apnea, surgery and certain medicines can increase your risk for breathing problems. Follow instructions from your health care provider about wearing your sleep device: ? Anytime you are sleeping, including during daytime naps. ? While taking prescription pain medicines, sleeping medicines, or medicines that make you drowsy.  Have a responsible adult stay with you for the time you are told. It is important to have someone help care for you until you are awake and alert.  Return to your normal activities as told by your health care provider. Ask your health care provider what activities are safe for you.  Take over-the-counter and prescription medicines only as told by your health care provider.  If you smoke, do not smoke without supervision.  Keep all follow-up visits as told by your health care provider. This is important. Contact a health care provider if:  You have nausea or vomiting that does not get better with medicine.  You  cannot eat or drink without vomiting.  You have pain that does not get better with medicine.  You are unable to pass urine.  You develop a skin rash.  You have a fever.  You have redness around your IV site that gets worse. Get help right away if:  You have difficulty breathing.  You have chest pain.  You have blood in your urine or stool, or you vomit blood. Summary  After the procedure, it is common to have a sore throat or nausea. It is also common to feel tired.  Have a responsible adult stay with you for the time you are told. It is important to have someone help care for you until you are awake and alert.  When you feel hungry, start by eating small amounts of foods that are soft and easy to digest (bland), such as toast. Gradually return to your regular diet.  Drink enough fluid to keep your urine pale yellow.  Return to your normal activities as told by your health care provider. Ask your health care provider what activities are safe for you. This information is not intended to replace advice given to you by your health care provider. Make sure you discuss any questions you have with your health care provider. Document Revised: 06/04/2020 Document Reviewed: 01/02/2020 Elsevier Patient Education  2021 [employer].

## 2021-02-02 ENCOUNTER — Other Ambulatory Visit (HOSPITAL_COMMUNITY): Payer: Medicaid Other

## 2021-02-02 ENCOUNTER — Encounter (HOSPITAL_COMMUNITY)
Admission: RE | Admit: 2021-02-02 | Discharge: 2021-02-02 | Disposition: A | Discharge status: Home / Self Care | Payer: Medicare Other | Source: Ambulatory Visit | Attending: Urology | Admitting: Urology

## 2021-02-02 ENCOUNTER — Other Ambulatory Visit: Payer: Self-pay

## 2021-02-02 ENCOUNTER — Encounter (HOSPITAL_COMMUNITY): Payer: Self-pay

## 2021-02-02 DIAGNOSIS — Z01812 Encounter for preprocedural laboratory examination: Secondary | ICD-10-CM | POA: Insufficient documentation

## 2021-02-04 ENCOUNTER — Ambulatory Visit (HOSPITAL_COMMUNITY): Payer: Medicare Other

## 2021-02-04 ENCOUNTER — Encounter (HOSPITAL_COMMUNITY): Payer: Self-pay | Admitting: Urology

## 2021-02-04 ENCOUNTER — Ambulatory Visit (HOSPITAL_COMMUNITY): Payer: Medicare Other | Admitting: Anesthesiology

## 2021-02-04 ENCOUNTER — Inpatient Hospital Stay (HOSPITAL_COMMUNITY)
Admission: RE | Admit: 2021-02-04 | Discharge: 2021-02-08 | DRG: 871 | Disposition: A | Discharge status: Home / Self Care | Payer: Medicare Other | Attending: Family Medicine | Admitting: Family Medicine

## 2021-02-04 ENCOUNTER — Other Ambulatory Visit (HOSPITAL_COMMUNITY)
Admission: RE | Admit: 2021-02-04 | Discharge: 2021-02-04 | Disposition: A | Discharge status: Home / Self Care | Payer: Medicare Other | Source: Ambulatory Visit | Attending: Urology | Admitting: Urology

## 2021-02-04 ENCOUNTER — Encounter (HOSPITAL_COMMUNITY)
Admission: RE | Disposition: A | Discharge status: Nursing Home (Includes ICF) | Payer: Self-pay | Source: Home / Self Care | Attending: Family Medicine

## 2021-02-04 DIAGNOSIS — H547 Unspecified visual loss: Secondary | ICD-10-CM | POA: Diagnosis present

## 2021-02-04 DIAGNOSIS — Z87820 Personal history of traumatic brain injury: Secondary | ICD-10-CM | POA: Diagnosis not present

## 2021-02-04 DIAGNOSIS — A419 Sepsis, unspecified organism: Principal | ICD-10-CM | POA: Diagnosis present

## 2021-02-04 DIAGNOSIS — E785 Hyperlipidemia, unspecified: Secondary | ICD-10-CM

## 2021-02-04 DIAGNOSIS — R6520 Severe sepsis without septic shock: Secondary | ICD-10-CM | POA: Diagnosis present

## 2021-02-04 DIAGNOSIS — Z9049 Acquired absence of other specified parts of digestive tract: Secondary | ICD-10-CM

## 2021-02-04 DIAGNOSIS — Z539 Procedure and treatment not carried out, unspecified reason: Secondary | ICD-10-CM | POA: Diagnosis present

## 2021-02-04 DIAGNOSIS — Z791 Long term (current) use of non-steroidal anti-inflammatories (NSAID): Secondary | ICD-10-CM

## 2021-02-04 DIAGNOSIS — R0689 Other abnormalities of breathing: Secondary | ICD-10-CM

## 2021-02-04 DIAGNOSIS — I10 Essential (primary) hypertension: Secondary | ICD-10-CM | POA: Diagnosis not present

## 2021-02-04 DIAGNOSIS — Z6836 Body mass index (BMI) 36.0-36.9, adult: Secondary | ICD-10-CM | POA: Diagnosis not present

## 2021-02-04 DIAGNOSIS — I700 Atherosclerosis of aorta: Secondary | ICD-10-CM | POA: Diagnosis present

## 2021-02-04 DIAGNOSIS — Z0189 Encounter for other specified special examinations: Secondary | ICD-10-CM

## 2021-02-04 DIAGNOSIS — Z982 Presence of cerebrospinal fluid drainage device: Secondary | ICD-10-CM

## 2021-02-04 DIAGNOSIS — N139 Obstructive and reflux uropathy, unspecified: Secondary | ICD-10-CM | POA: Diagnosis present

## 2021-02-04 DIAGNOSIS — J9811 Atelectasis: Secondary | ICD-10-CM | POA: Diagnosis present

## 2021-02-04 DIAGNOSIS — Z20822 Contact with and (suspected) exposure to covid-19: Secondary | ICD-10-CM | POA: Diagnosis present

## 2021-02-04 DIAGNOSIS — J9601 Acute respiratory failure with hypoxia: Secondary | ICD-10-CM | POA: Diagnosis present

## 2021-02-04 DIAGNOSIS — Z8669 Personal history of other diseases of the nervous system and sense organs: Secondary | ICD-10-CM

## 2021-02-04 DIAGNOSIS — T17908A Unspecified foreign body in respiratory tract, part unspecified causing other injury, initial encounter: Secondary | ICD-10-CM | POA: Diagnosis present

## 2021-02-04 DIAGNOSIS — K5730 Diverticulosis of large intestine without perforation or abscess without bleeding: Secondary | ICD-10-CM | POA: Diagnosis present

## 2021-02-04 DIAGNOSIS — N201 Calculus of ureter: Secondary | ICD-10-CM | POA: Diagnosis not present

## 2021-02-04 DIAGNOSIS — Z880 Allergy status to penicillin: Secondary | ICD-10-CM | POA: Diagnosis not present

## 2021-02-04 DIAGNOSIS — J9588 Other intraoperative complications of respiratory system, not elsewhere classified: Secondary | ICD-10-CM | POA: Diagnosis not present

## 2021-02-04 DIAGNOSIS — K219 Gastro-esophageal reflux disease without esophagitis: Secondary | ICD-10-CM | POA: Diagnosis present

## 2021-02-04 DIAGNOSIS — R0602 Shortness of breath: Secondary | ICD-10-CM

## 2021-02-04 DIAGNOSIS — N202 Calculus of kidney with calculus of ureter: Secondary | ICD-10-CM | POA: Diagnosis present

## 2021-02-04 DIAGNOSIS — E669 Obesity, unspecified: Secondary | ICD-10-CM

## 2021-02-04 DIAGNOSIS — Z803 Family history of malignant neoplasm of breast: Secondary | ICD-10-CM

## 2021-02-04 DIAGNOSIS — Z7982 Long term (current) use of aspirin: Secondary | ICD-10-CM

## 2021-02-04 DIAGNOSIS — Z79899 Other long term (current) drug therapy: Secondary | ICD-10-CM

## 2021-02-04 DIAGNOSIS — Z87442 Personal history of urinary calculi: Secondary | ICD-10-CM

## 2021-02-04 DIAGNOSIS — J690 Pneumonitis due to inhalation of food and vomit: Secondary | ICD-10-CM | POA: Diagnosis present

## 2021-02-04 DIAGNOSIS — R0600 Dyspnea, unspecified: Secondary | ICD-10-CM

## 2021-02-04 DIAGNOSIS — Z888 Allergy status to other drugs, medicaments and biological substances status: Secondary | ICD-10-CM

## 2021-02-04 DIAGNOSIS — R1310 Dysphagia, unspecified: Secondary | ICD-10-CM | POA: Diagnosis present

## 2021-02-04 DIAGNOSIS — Z841 Family history of disorders of kidney and ureter: Secondary | ICD-10-CM

## 2021-02-04 DIAGNOSIS — K449 Diaphragmatic hernia without obstruction or gangrene: Secondary | ICD-10-CM | POA: Diagnosis present

## 2021-02-04 LAB — BLOOD GAS, ARTERIAL
Acid-Base Excess: 2.5 mmol/L — ABNORMAL HIGH (ref 0.0–2.0)
Bicarbonate: 26 mmol/L (ref 20.0–28.0)
FIO2: 100
O2 Saturation: 96 %
Patient temperature: 36.6
pCO2 arterial: 48 mmHg (ref 32.0–48.0)
pH, Arterial: 7.372 (ref 7.350–7.450)
pO2, Arterial: 87.6 mmHg (ref 83.0–108.0)

## 2021-02-04 LAB — COMPREHENSIVE METABOLIC PANEL
ALT: 53 U/L — ABNORMAL HIGH (ref 0–44)
AST: 48 U/L — ABNORMAL HIGH (ref 15–41)
Albumin: 4.4 g/dL (ref 3.5–5.0)
Alkaline Phosphatase: 108 U/L (ref 38–126)
Anion gap: 12 (ref 5–15)
BUN: 16 mg/dL (ref 6–20)
CO2: 26 mmol/L (ref 22–32)
Calcium: 9.2 mg/dL (ref 8.9–10.3)
Chloride: 100 mmol/L (ref 98–111)
Creatinine, Ser: 0.68 mg/dL (ref 0.61–1.24)
GFR, Estimated: >60 mL/min (ref >60)
Glucose, Bld: 163 mg/dL — ABNORMAL HIGH (ref 70–99)
Potassium: 4 mmol/L (ref 3.5–5.1)
Sodium: 138 mmol/L (ref 135–145)
Total Bilirubin: 0.7 mg/dL (ref 0.3–1.2)
Total Protein: 8.5 g/dL — ABNORMAL HIGH (ref 6.5–8.1)

## 2021-02-04 LAB — CBC WITH DIFFERENTIAL/PLATELET
Abs Immature Granulocytes: 0.05 10*3/uL (ref 0.00–0.07)
Basophils Absolute: 0.1 10*3/uL (ref 0.0–0.1)
Basophils Relative: 1 %
Eosinophils Absolute: 0.1 10*3/uL (ref 0.0–0.5)
Eosinophils Relative: 1 %
HCT: 46.5 % (ref 39.0–52.0)
Hemoglobin: 14.8 g/dL (ref 13.0–17.0)
Immature Granulocytes: 0 %
Lymphocytes Relative: 13 %
Lymphs Abs: 1.6 10*3/uL (ref 0.7–4.0)
MCH: 27.6 pg (ref 26.0–34.0)
MCHC: 31.8 g/dL (ref 30.0–36.0)
MCV: 86.6 fL (ref 80.0–100.0)
Monocytes Absolute: 0.2 10*3/uL (ref 0.1–1.0)
Monocytes Relative: 2 %
Neutro Abs: 10.7 10*3/uL — ABNORMAL HIGH (ref 1.7–7.7)
Neutrophils Relative %: 83 %
Platelets: 294 10*3/uL (ref 150–400)
RBC: 5.37 MIL/uL (ref 4.22–5.81)
RDW: 15.2 % (ref 11.5–15.5)
WBC: 12.8 10*3/uL — ABNORMAL HIGH (ref 4.0–10.5)
nRBC: 0 % (ref 0.0–0.2)

## 2021-02-04 LAB — MAGNESIUM: Magnesium: 2 mg/dL (ref 1.7–2.4)

## 2021-02-04 LAB — PHOSPHORUS: Phosphorus: 3.6 mg/dL (ref 2.5–4.6)

## 2021-02-04 LAB — MRSA PCR SCREENING: MRSA by PCR: NEGATIVE

## 2021-02-04 LAB — SARS CORONAVIRUS 2 BY RT PCR (HOSPITAL ORDER, PERFORMED IN ~~LOC~~ HOSPITAL LAB): SARS Coronavirus 2: NEGATIVE

## 2021-02-04 SURGERY — CANCELLED PROCEDURE
Anesthesia: General

## 2021-02-04 MED ORDER — HYDRALAZINE HCL 20 MG/ML IJ SOLN: 10.0000 mg | Freq: Four times a day (QID) | INTRAMUSCULAR | Status: DC | PRN

## 2021-02-04 MED ORDER — IPRATROPIUM BROMIDE 0.02 % IN SOLN
0.5000 mg | Freq: Four times a day (QID) | RESPIRATORY_TRACT | Status: DC
  Administered 2021-02-04 – 2021-02-05 (×2): 0.5 mg via RESPIRATORY_TRACT
  Filled 2021-02-04 (×2): qty 2.5

## 2021-02-04 MED ORDER — ROCURONIUM BROMIDE 100 MG/10ML IV SOLN
INTRAVENOUS | Status: DC | PRN
  Administered 2021-02-04: 100 mg via INTRAVENOUS

## 2021-02-04 MED ORDER — DEXAMETHASONE SODIUM PHOSPHATE 10 MG/ML IJ SOLN
INTRAMUSCULAR | Status: DC | PRN
  Administered 2021-02-04 (×2): 5 mg via INTRAVENOUS

## 2021-02-04 MED ORDER — FENTANYL CITRATE (PF) 100 MCG/2ML IJ SOLN
INTRAMUSCULAR | Status: AC
  Filled 2021-02-04: qty 2

## 2021-02-04 MED ORDER — CHLORHEXIDINE GLUCONATE 0.12 % MT SOLN
15.0000 mL | Freq: Once | OROMUCOSAL | Status: AC
  Administered 2021-02-04: 15 mL via OROMUCOSAL
  Filled 2021-02-04: qty 15

## 2021-02-04 MED ORDER — ATORVASTATIN CALCIUM 40 MG PO TABS
40.0000 mg | ORAL_TABLET | Freq: Every day | ORAL | Status: DC
  Administered 2021-02-04 – 2021-02-07 (×4): 40 mg
  Filled 2021-02-04 (×4): qty 1

## 2021-02-04 MED ORDER — BISACODYL 10 MG RE SUPP: 10.0000 mg | Freq: Every day | RECTAL | Status: DC | PRN

## 2021-02-04 MED ORDER — PROPOFOL 10 MG/ML IV BOLUS
INTRAVENOUS | Status: DC | PRN
  Administered 2021-02-04: 150 mg via INTRAVENOUS

## 2021-02-04 MED ORDER — LACTATED RINGERS IV SOLN: INTRAVENOUS | Status: DC

## 2021-02-04 MED ORDER — MIDAZOLAM HCL 2 MG/2ML IJ SOLN
INTRAMUSCULAR | Status: AC
  Filled 2021-02-04: qty 2

## 2021-02-04 MED ORDER — SODIUM CHLORIDE 0.9 % IV SOLN
1.0000 g | INTRAVENOUS | Status: DC
  Administered 2021-02-05 – 2021-02-08 (×4): 1 g via INTRAVENOUS
  Filled 2021-02-04 (×4): qty 10

## 2021-02-04 MED ORDER — ACETAMINOPHEN 650 MG RE SUPP: 650.0000 mg | Freq: Four times a day (QID) | RECTAL | Status: DC | PRN

## 2021-02-04 MED ORDER — ALBUTEROL SULFATE (2.5 MG/3ML) 0.083% IN NEBU: 2.5000 mg | INHALATION_SOLUTION | RESPIRATORY_TRACT | Status: DC | PRN

## 2021-02-04 MED ORDER — LIDOCAINE HCL (CARDIAC) PF 100 MG/5ML IV SOSY
PREFILLED_SYRINGE | INTRAVENOUS | Status: DC | PRN
  Administered 2021-02-04: 100 mg via INTRAVENOUS
  Administered 2021-02-04: 60 mg via INTRAVENOUS

## 2021-02-04 MED ORDER — ONDANSETRON HCL 4 MG/2ML IJ SOLN: 4.0000 mg | Freq: Four times a day (QID) | INTRAMUSCULAR | Status: DC | PRN

## 2021-02-04 MED ORDER — PANTOPRAZOLE SODIUM 40 MG IV SOLR
40.0000 mg | INTRAVENOUS | Status: DC
  Administered 2021-02-04 – 2021-02-07 (×4): 40 mg via INTRAVENOUS
  Filled 2021-02-04 (×4): qty 40

## 2021-02-04 MED ORDER — EPHEDRINE SULFATE 50 MG/ML IJ SOLN
INTRAMUSCULAR | Status: DC | PRN
  Administered 2021-02-04 (×2): 10 mg via INTRAVENOUS

## 2021-02-04 MED ORDER — SUCCINYLCHOLINE CHLORIDE 20 MG/ML IJ SOLN
INTRAMUSCULAR | Status: DC | PRN
  Administered 2021-02-04: 180 mg via INTRAVENOUS

## 2021-02-04 MED ORDER — SODIUM CHLORIDE 0.9 % IV SOLN
2.0000 g | INTRAVENOUS | Status: AC
  Administered 2021-02-04: 2 g via INTRAVENOUS

## 2021-02-04 MED ORDER — DIATRIZOATE MEGLUMINE 30 % UR SOLN
URETHRAL | Status: AC
  Filled 2021-02-04: qty 100

## 2021-02-04 MED ORDER — MIDAZOLAM HCL 2 MG/2ML IJ SOLN
2.0000 mg | Freq: Once | INTRAMUSCULAR | Status: AC
  Administered 2021-02-04: 2 mg via INTRAVENOUS

## 2021-02-04 MED ORDER — FENTANYL CITRATE (PF) 250 MCG/5ML IJ SOLN
INTRAMUSCULAR | Status: DC | PRN
  Administered 2021-02-04: 50 ug via INTRAVENOUS

## 2021-02-04 MED ORDER — CHLORHEXIDINE GLUCONATE CLOTH 2 % EX PADS
6.0000 | MEDICATED_PAD | Freq: Every day | CUTANEOUS | Status: DC
  Administered 2021-02-05 – 2021-02-08 (×3): 6 via TOPICAL

## 2021-02-04 MED ORDER — ONDANSETRON HCL 4 MG PO TABS: 4.0000 mg | ORAL_TABLET | Freq: Four times a day (QID) | ORAL | Status: DC | PRN

## 2021-02-04 MED ORDER — SODIUM CHLORIDE 0.9 % IV SOLN: INTRAVENOUS | Status: DC

## 2021-02-04 MED ORDER — SODIUM CHLORIDE 0.9 % IV SOLN
INTRAVENOUS | Status: AC
  Filled 2021-02-04: qty 20

## 2021-02-04 MED ORDER — POLYETHYLENE GLYCOL 3350 17 G PO PACK
17.0000 g | PACK | Freq: Every day | ORAL | Status: DC
  Administered 2021-02-04 – 2021-02-08 (×5): 17 g
  Filled 2021-02-04 (×4): qty 1

## 2021-02-04 MED ORDER — DOCUSATE SODIUM 50 MG/5ML PO LIQD
100.0000 mg | Freq: Two times a day (BID) | ORAL | Status: DC
  Administered 2021-02-04 – 2021-02-05 (×2): 100 mg
  Filled 2021-02-04 (×2): qty 10

## 2021-02-04 MED ORDER — FENTANYL CITRATE (PF) 100 MCG/2ML IJ SOLN
50.0000 ug | INTRAMUSCULAR | Status: AC | PRN
  Administered 2021-02-04 – 2021-02-05 (×3): 50 ug via INTRAVENOUS
  Filled 2021-02-04 (×2): qty 2

## 2021-02-04 MED ORDER — HEPARIN SODIUM (PORCINE) 5000 UNIT/ML IJ SOLN
5000.0000 [IU] | Freq: Three times a day (TID) | INTRAMUSCULAR | Status: AC
  Administered 2021-02-04 – 2021-02-07 (×10): 5000 [IU] via SUBCUTANEOUS
  Filled 2021-02-04 (×10): qty 1

## 2021-02-04 MED ORDER — ORAL CARE MOUTH RINSE
15.0000 mL | Freq: Two times a day (BID) | OROMUCOSAL | Status: DC
  Administered 2021-02-05: 15 mL via OROMUCOSAL

## 2021-02-04 MED ORDER — PROPOFOL 1000 MG/100ML IV EMUL
0.0000 ug/kg/min | INTRAVENOUS | Status: DC
  Administered 2021-02-04: 45 ug/kg/min via INTRAVENOUS
  Administered 2021-02-04: 5 ug/kg/min via INTRAVENOUS
  Administered 2021-02-04: 40 ug/kg/min via INTRAVENOUS
  Administered 2021-02-04: 5 ug/kg/min via INTRAVENOUS
  Administered 2021-02-05 (×2): 25 ug/kg/min via INTRAVENOUS
  Filled 2021-02-04 (×4): qty 100

## 2021-02-04 MED ORDER — ORAL CARE MOUTH RINSE: 15.0000 mL | Freq: Once | OROMUCOSAL | Status: AC

## 2021-02-04 MED ORDER — ONDANSETRON HCL 4 MG/2ML IJ SOLN
INTRAMUSCULAR | Status: DC | PRN
  Administered 2021-02-04: 4 mg via INTRAVENOUS

## 2021-02-04 MED ORDER — FENTANYL CITRATE (PF) 100 MCG/2ML IJ SOLN
50.0000 ug | INTRAMUSCULAR | Status: DC | PRN
Start: 2021-02-04 — End: 2021-02-05
  Administered 2021-02-05 (×2): 50 ug via INTRAVENOUS
  Filled 2021-02-04 (×3): qty 2

## 2021-02-04 MED ORDER — ESCITALOPRAM OXALATE 10 MG PO TABS
10.0000 mg | ORAL_TABLET | Freq: Every day | ORAL | Status: DC
  Administered 2021-02-05 – 2021-02-07 (×3): 10 mg
  Filled 2021-02-04 (×3): qty 1

## 2021-02-04 MED ORDER — ACETAMINOPHEN 325 MG PO TABS
650.0000 mg | ORAL_TABLET | Freq: Four times a day (QID) | ORAL | Status: DC | PRN
  Administered 2021-02-07: 650 mg via ORAL
  Filled 2021-02-04: qty 2

## 2021-02-04 MED ORDER — METRONIDAZOLE 500 MG/100ML IV SOLN
500.0000 mg | Freq: Three times a day (TID) | INTRAVENOUS | Status: DC
  Administered 2021-02-04 – 2021-02-08 (×12): 500 mg via INTRAVENOUS
  Filled 2021-02-04 (×12): qty 100

## 2021-02-04 MED ORDER — MIDAZOLAM HCL 2 MG/2ML IJ SOLN
INTRAMUSCULAR | Status: DC | PRN
  Administered 2021-02-04: 4 mg via INTRAVENOUS

## 2021-02-04 MED ORDER — ALBUTEROL SULFATE (2.5 MG/3ML) 0.083% IN NEBU
2.5000 mg | INHALATION_SOLUTION | Freq: Four times a day (QID) | RESPIRATORY_TRACT | Status: DC
  Administered 2021-02-04 – 2021-02-05 (×2): 2.5 mg via RESPIRATORY_TRACT
  Filled 2021-02-04 (×2): qty 3

## 2021-02-04 MED ORDER — CHLORHEXIDINE GLUCONATE 0.12 % MT SOLN
15.0000 mL | Freq: Two times a day (BID) | OROMUCOSAL | Status: DC
  Administered 2021-02-04 – 2021-02-08 (×4): 15 mL via OROMUCOSAL
  Filled 2021-02-04 (×3): qty 15

## 2021-02-04 SURGICAL SUPPLY — 19 items
BAG DRAIN URO TABLE W/ADPT NS (BAG) ×3 IMPLANT
BAG DRN 8 ADPR NS SKTRN CSTL (BAG) ×1
BAG HAMPER (MISCELLANEOUS) ×3 IMPLANT
CATH INTERMIT  6FR 70CM (CATHETERS) ×3 IMPLANT
CLOTH BEACON ORANGE TIMEOUT ST (SAFETY) ×3 IMPLANT
DECANTER SPIKE VIAL GLASS SM (MISCELLANEOUS) ×3 IMPLANT
GLOVE BIO SURGEON STRL SZ8 (GLOVE) ×3 IMPLANT
GLOVE SURG UNDER POLY LF SZ7 (GLOVE) ×6 IMPLANT
GOWN STRL REUS W/TWL LRG LVL3 (GOWN DISPOSABLE) ×3 IMPLANT
GOWN STRL REUS W/TWL XL LVL3 (GOWN DISPOSABLE) ×3 IMPLANT
GUIDEWIRE STR ZIPWIRE 035X150 (MISCELLANEOUS) ×3 IMPLANT
IV NS IRRIG 3000ML ARTHROMATIC (IV SOLUTION) ×3 IMPLANT
KIT TURNOVER CYSTO (KITS) ×3 IMPLANT
MANIFOLD NEPTUNE II (INSTRUMENTS) ×3 IMPLANT
PACK CYSTO (CUSTOM PROCEDURE TRAY) ×3 IMPLANT
PAD ARMBOARD 7.5X6 YLW CONV (MISCELLANEOUS) ×3 IMPLANT
SYR 10ML LL (SYRINGE) ×3 IMPLANT
TOWEL OR 17X26 4PK STRL BLUE (TOWEL DISPOSABLE) ×3 IMPLANT
WATER STERILE IRR 500ML POUR (IV SOLUTION) ×3 IMPLANT

## 2021-02-04 NOTE — Interval H&P Note (Signed)
History and Physical Interval Note:  02/04/2021 12:54 PM  [name]  has presented today for surgery, with the diagnosis of right ureteral calculus.  The various methods of treatment have been discussed with the patient and family. After consideration of risks, benefits and other options for treatment, the patient has consented to  Procedure(s): CYSTOSCOPY WITH RETROGRADE PYELOGRAM/URETERAL STENT EXCHANGE (Right) HOLMIUM LASER APPLICATION (Right) as a surgical intervention.  The patient's history has been reviewed, patient examined, no change in status, stable for surgery.  I have reviewed the patient's chart and labs.  Questions were answered to the patient's satisfaction.     Nicolette Bang
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
## 2021-02-04 NOTE — Anesthesia Procedure Notes (Signed)
Procedure Name: LMA Insertion Date/Time: 02/04/2021 1:22 PM Performed by: Karna Dupes, CRNA Pre-anesthesia Checklist: Patient identified, Emergency Drugs available, Suction available and Patient being monitored Patient Re-evaluated:Patient Re-evaluated prior to induction Oxygen Delivery Method: Circle system utilized Preoxygenation: Pre-oxygenation with 100% oxygen Induction Type: IV induction LMA: LMA inserted LMA Size: 4.0 Number of attempts: 1 Tube secured with: Tape Dental Injury: Teeth and Oropharynx as per pre-operative assessment

## 2021-02-04 NOTE — Anesthesia Procedure Notes (Signed)
Procedure Name: Intubation Date/Time: 02/04/2021 1:29 PM Performed by: Karna Dupes, CRNA Pre-anesthesia Checklist: Patient identified, Emergency Drugs available, Suction available and Patient being monitored Patient Re-evaluated:Patient Re-evaluated prior to induction Oxygen Delivery Method: Circle system utilized Preoxygenation: Pre-oxygenation with 100% oxygen Induction Type: IV induction, Rapid sequence and Cricoid Pressure applied Laryngoscope Size: Mac and 4 Grade View: Grade I Tube type: Oral Tube size: 7.5 mm Number of attempts: 1 Airway Equipment and Method: Stylet Placement Confirmation: ETT inserted through vocal cords under direct vision,  positive ETCO2 and breath sounds checked- equal and bilateral Secured at: 23 cm Tube secured with: Tape Dental Injury: Teeth and Oropharynx as per pre-operative assessment

## 2021-02-04 NOTE — Anesthesia Preprocedure Evaluation (Addendum)
Anesthesia Evaluation  Patient identified by MRN, date of birth, ID band Patient awake    Reviewed: Allergy & Precautions, NPO status , Patient's Chart, lab work & pertinent test results, reviewed documented beta blocker date and time   History of Anesthesia Complications Negative for: history of anesthetic complications  Airway Mallampati: II  TM Distance: >3 FB Neck ROM: Full    Dental  (+) Dental Advisory Given, Teeth Intact   Pulmonary neg pulmonary ROS,    Pulmonary exam normal breath sounds clear to auscultation       Cardiovascular Exercise Tolerance: Poor METS (poor lower extremity strength): hypertension, Pt. on medications and Pt. on home beta blockers Normal cardiovascular exam Rhythm:Regular Rate:Normal     Neuro/Psych Hypoxic brain injury  Neuromuscular disease    GI/Hepatic Neg liver ROS, GERD  Medicated and Controlled,  Endo/Other  negative endocrine ROS  Renal/GU Renal disease (stones )     Musculoskeletal negative musculoskeletal ROS (+)   Abdominal   Peds  Hematology negative hematology ROS (+)   Anesthesia Other Findings  hypoxic brain injury, blindness  Reproductive/Obstetrics negative OB ROS                           Anesthesia Physical Anesthesia Plan  ASA: III  Anesthesia Plan: General   Post-op Pain Management:    Induction: Intravenous  PONV Risk Score and Plan: 4 or greater and Ondansetron, Dexamethasone and Midazolam  Airway Management Planned: LMA  Additional Equipment:   Intra-op Plan:   Post-operative Plan: Extubation in OR  Informed Consent: I have reviewed the patients History and Physical, chart, labs and discussed the procedure including the risks, benefits and alternatives for the proposed anesthesia with the patient or authorized representative who has indicated his/her understanding and acceptance.     Dental advisory given  Plan  Discussed with: CRNA and Surgeon  Anesthesia Plan Comments:        Anesthesia Quick Evaluation

## 2021-02-04 NOTE — Progress Notes (Signed)
I know this is not the rapid test. I can't seem to find it. I try putting in Covid rapid,and I've tried putting the long name that comes up on the rapid sheet when it's printed, nothing.  Please help.

## 2021-02-04 NOTE — Progress Notes (Signed)
  Discussed with his Mother at bedside  ABG reviewed (was done on 100 %F102)  Current Vent settings after adjustments:- PRVC/60%/5/18/510  Continue iv Propofol drip along prn iv fentanyl pushes - Roxan Hockey, MD

## 2021-02-04 NOTE — Plan of Care (Signed)
  Problem: Clinical Measurements: Goal: Will remain free from infection Outcome: Progressing Goal: Diagnostic test results will improve Outcome: Progressing Goal: Respiratory complications will improve Outcome: Progressing   Problem: Safety: Goal: Ability to remain free from injury will improve Outcome: Progressing   Problem: Skin Integrity: Goal: Risk for impaired skin integrity will decrease Outcome: Progressing   

## 2021-02-04 NOTE — Progress Notes (Signed)
Pt mom allowed to stay overnight at bedside due to helping keep the patient calm when he tries to pull out the tube. Discussed with [employer] and charge nurses and stated it was up to charge nurse discretion.

## 2021-02-04 NOTE — Anesthesia Postprocedure Evaluation (Signed)
Anesthesia Post Note  Patient: [name]  Procedure(s) Performed: CYSTOSCOPY WITH RIGHT RETROGRADE PYELOGRAM, RIGHT URETEROSCOPY, RIGHT URETERAL STENT EXCHANGE (Right ) HOLMIUM LASER APPLICATION (Right )  Patient location during evaluation: ICU Anesthesia Type: General Level of consciousness: sedated and patient remains intubated per anesthesia plan Pain management: pain level controlled Respiratory status: patient remains intubated per anesthesia plan Cardiovascular status: stable Postop Assessment: no apparent nausea or vomiting Anesthesia complication:   Comments: Patient was scheduled for cystoscopy under GA, LMA was inserted without any difficulty, after legs were put in stirrups, patient vomited large amounts of gastric fluid, LMA removed, suctioned intubated with 7.5  ETT, suctioned gastric fluid from ETT and transferred ICU, endorsed to ICU team.   No complications documented.   Last Vitals:  Vitals:   02/04/21 1218 02/04/21 1407  BP: (!) 146/87   Pulse: (!) 52   Resp: 18   Temp: 36.6 C   SpO2: 99% 92%    Last Pain:  Vitals:   02/04/21 1218  PainSc: 7                  Ladasia Sircy C Inaya Gillham

## 2021-02-04 NOTE — Transfer of Care (Signed)
Immediate Anesthesia Transfer of Care Note  Patient: [name]  Procedure(s) Performed: CYSTOSCOPY WITH RIGHT RETROGRADE PYELOGRAM, RIGHT URETEROSCOPY, RIGHT URETERAL STENT EXCHANGE (Right ) HOLMIUM LASER APPLICATION (Right )  Patient Location: ICU  Anesthesia Type:General  Level of Consciousness: sedated  Airway & Oxygen Therapy: Patient remains intubated per anesthesia plan and Patient placed on Ventilator (see vital sign flow sheet for setting)  Post-op Assessment: Report given to RN  Post vital signs: Reviewed  Last Vitals:  Vitals Value Taken Time  BP 129/71 02/04/21 1430  Temp    Pulse 58 02/04/21 1441  Resp 16 02/04/21 1441  SpO2 98 % 02/04/21 1441  Vitals shown include unvalidated device data.  Last Pain:  Vitals:   02/04/21 1218  PainSc: 7          Complications: No complications documented.

## 2021-02-05 ENCOUNTER — Inpatient Hospital Stay (HOSPITAL_COMMUNITY): Payer: Medicare Other

## 2021-02-05 DIAGNOSIS — T17908A Unspecified foreign body in respiratory tract, part unspecified causing other injury, initial encounter: Secondary | ICD-10-CM | POA: Diagnosis not present

## 2021-02-05 DIAGNOSIS — Z8669 Personal history of other diseases of the nervous system and sense organs: Secondary | ICD-10-CM

## 2021-02-05 DIAGNOSIS — A419 Sepsis, unspecified organism: Secondary | ICD-10-CM

## 2021-02-05 DIAGNOSIS — H547 Unspecified visual loss: Secondary | ICD-10-CM

## 2021-02-05 DIAGNOSIS — J9588 Other intraoperative complications of respiratory system, not elsewhere classified: Secondary | ICD-10-CM | POA: Diagnosis not present

## 2021-02-05 DIAGNOSIS — N201 Calculus of ureter: Secondary | ICD-10-CM

## 2021-02-05 LAB — COMPREHENSIVE METABOLIC PANEL
ALT: 40 U/L (ref 0–44)
AST: 32 U/L (ref 15–41)
Albumin: 3.8 g/dL (ref 3.5–5.0)
Alkaline Phosphatase: 92 U/L (ref 38–126)
Anion gap: 11 (ref 5–15)
BUN: 20 mg/dL (ref 6–20)
CO2: 25 mmol/L (ref 22–32)
Calcium: 9.5 mg/dL (ref 8.9–10.3)
Chloride: 103 mmol/L (ref 98–111)
Creatinine, Ser: 0.83 mg/dL (ref 0.61–1.24)
GFR, Estimated: >60 mL/min (ref >60)
Glucose, Bld: 154 mg/dL — ABNORMAL HIGH (ref 70–99)
Potassium: 3.7 mmol/L (ref 3.5–5.1)
Sodium: 139 mmol/L (ref 135–145)
Total Bilirubin: 0.5 mg/dL (ref 0.3–1.2)
Total Protein: 7.6 g/dL (ref 6.5–8.1)

## 2021-02-05 LAB — URINALYSIS, COMPLETE (UACMP) WITH MICROSCOPIC
Bilirubin Urine: NEGATIVE
Glucose, UA: NEGATIVE mg/dL
Ketones, ur: NEGATIVE mg/dL
Nitrite: NEGATIVE
Protein, ur: 30 mg/dL — AB
RBC / HPF: >50 RBC/hpf — ABNORMAL HIGH (ref 0–5)
Specific Gravity, Urine: 1.012 (ref 1.005–1.030)
WBC, UA: >50 WBC/hpf — ABNORMAL HIGH (ref 0–5)
pH: 7 (ref 5.0–8.0)

## 2021-02-05 LAB — CBC WITH DIFFERENTIAL/PLATELET
Abs Immature Granulocytes: 0.18 10*3/uL — ABNORMAL HIGH (ref 0.00–0.07)
Basophils Absolute: 0.1 10*3/uL (ref 0.0–0.1)
Basophils Relative: 0 %
Eosinophils Absolute: 0.3 10*3/uL (ref 0.0–0.5)
Eosinophils Relative: 2 %
HCT: 44.1 % (ref 39.0–52.0)
Hemoglobin: 13.9 g/dL (ref 13.0–17.0)
Immature Granulocytes: 1 %
Lymphocytes Relative: 6 %
Lymphs Abs: 1.3 10*3/uL (ref 0.7–4.0)
MCH: 27.6 pg (ref 26.0–34.0)
MCHC: 31.5 g/dL (ref 30.0–36.0)
MCV: 87.7 fL (ref 80.0–100.0)
Monocytes Absolute: 0.6 10*3/uL (ref 0.1–1.0)
Monocytes Relative: 3 %
Neutro Abs: 20 10*3/uL — ABNORMAL HIGH (ref 1.7–7.7)
Neutrophils Relative %: 88 %
Platelets: 345 10*3/uL (ref 150–400)
RBC: 5.03 MIL/uL (ref 4.22–5.81)
RDW: 15.4 % (ref 11.5–15.5)
WBC: 22.4 10*3/uL — ABNORMAL HIGH (ref 4.0–10.5)
nRBC: 0 % (ref 0.0–0.2)

## 2021-02-05 LAB — TRIGLYCERIDES: Triglycerides: 252 mg/dL — ABNORMAL HIGH (ref <150)

## 2021-02-05 LAB — GLUCOSE, CAPILLARY: Glucose-Capillary: 156 mg/dL — ABNORMAL HIGH (ref 70–99)

## 2021-02-05 LAB — MAGNESIUM: Magnesium: 2 mg/dL (ref 1.7–2.4)

## 2021-02-05 LAB — PHOSPHORUS: Phosphorus: 3.5 mg/dL (ref 2.5–4.6)

## 2021-02-05 MED ORDER — GUAIFENESIN ER 600 MG PO TB12
600.0000 mg | ORAL_TABLET | Freq: Two times a day (BID) | ORAL | Status: DC
  Administered 2021-02-05 – 2021-02-08 (×7): 600 mg via ORAL
  Filled 2021-02-05 (×7): qty 1

## 2021-02-05 MED ORDER — ORAL CARE MOUTH RINSE
15.0000 mL | Freq: Two times a day (BID) | OROMUCOSAL | Status: DC
  Administered 2021-02-08: 15 mL via OROMUCOSAL

## 2021-02-05 MED ORDER — IPRATROPIUM-ALBUTEROL 0.5-2.5 (3) MG/3ML IN SOLN
3.0000 mL | Freq: Four times a day (QID) | RESPIRATORY_TRACT | Status: DC
  Administered 2021-02-05 (×3): 3 mL via RESPIRATORY_TRACT
  Filled 2021-02-05 (×3): qty 3

## 2021-02-05 MED ORDER — SENNOSIDES-DOCUSATE SODIUM 8.6-50 MG PO TABS
2.0000 | ORAL_TABLET | Freq: Every day | ORAL | Status: DC
  Administered 2021-02-05 – 2021-02-07 (×3): 2 via ORAL
  Filled 2021-02-05 (×3): qty 2

## 2021-02-05 MED ORDER — AMLODIPINE BESYLATE 5 MG PO TABS
5.0000 mg | ORAL_TABLET | Freq: Every day | ORAL | Status: DC
  Administered 2021-02-05 – 2021-02-08 (×4): 5 mg via ORAL
  Filled 2021-02-05 (×4): qty 1

## 2021-02-05 MED ORDER — IPRATROPIUM-ALBUTEROL 0.5-2.5 (3) MG/3ML IN SOLN
3.0000 mL | Freq: Three times a day (TID) | RESPIRATORY_TRACT | Status: DC
  Administered 2021-02-06 – 2021-02-07 (×6): 3 mL via RESPIRATORY_TRACT
  Filled 2021-02-05 (×6): qty 3

## 2021-02-05 MED ORDER — ORAL CARE MOUTH RINSE
15.0000 mL | OROMUCOSAL | Status: DC
  Administered 2021-02-05: 15 mL via OROMUCOSAL

## 2021-02-05 MED ORDER — CHLORHEXIDINE GLUCONATE 0.12% ORAL RINSE (MEDLINE KIT)
15.0000 mL | Freq: Two times a day (BID) | OROMUCOSAL | Status: DC
  Administered 2021-02-05: 15 mL via OROMUCOSAL

## 2021-02-05 NOTE — Consult Note (Addendum)
NAME:  [name], MRN:  [id number], DOB:  January 13, 1961, LOS: 1 ADMISSION DATE:  02/04/2021, CONSULTATION DATE:  5/6 REFERRING MD:  Dereck Ligas  CHIEF COMPLAINT:  resp failure    History of Present Illness:  60  yowm with medical history significant for but not limited too blindness, history of GERD, hyperlipidemia, hypertension, history of hypoxic brain injury at the age of 40, history of muscle spasms, obesity as well as other comorbidities including nephrolithiasis who presented today for cystoscopy with retrograde pyelogram and ureteral stent exchange as well as a Foley and laser application on the right for his right ureteral calculus and nephrolithiasis.  Patient underwent a ureteral stent placement on 12/04/2020 for a 5 mm distal ureteral calculus with UTI.  He started having right flank pain with a stent in place and urology discussed the management of kidney stones and the plan is for a right ureteroscopic stone extraction.  He presented from his group home today for this procedure however they told the staff that he was in n.p.o. status but he had actually eaten this morning.  Patient was scheduled for cystoscopy under general anesthesia and LMA had been placed and patient was given fentany and l Versed for the procedure however then subsequently he was noted that he had gastric secretions in his LMA.  He underwent rapid sequence intubation given the large amount of gastric fluid noted with concern for vomiting.  LMA was removed and patient was suctioned and intubated with a 7-1/2 ET tube and transferred to ICU.  Further management  Baseline able to get from bed to w/c but extremely sedentary at group home per mother s chronic or recurrent resp events or need for 02 or resp meds.  Pertinent  Medical History  Blind, GERD (gastroesophageal reflux disease), Hyperlipidemia, Hypertension, Hypoxic brain injury ([place]), Muscle spasms of head and/or neck, and Obesity.      Significant Hospital  Events: Including procedures, antibiotic start and stop dates in addition to other pertinent events   . ET  5/5 > 5/6  . Rocephin 5/5  . Flagyl 5/5    Scheduled Meds: . atorvastatin  40 mg Per Tube QHS  . chlorhexidine  15 mL Mouth/Throat BID   Or  . mouth rinse  15 mL Mouth Rinse BID  . chlorhexidine gluconate (MEDLINE KIT)  15 mL Mouth Rinse BID  . Chlorhexidine Gluconate Cloth  6 each Topical Daily  . docusate  100 mg Per Tube BID  . escitalopram  10 mg Per Tube QHS  . heparin  5,000 Units Subcutaneous Q8H  . ipratropium-albuterol  3 mL Nebulization Q6H  . mouth rinse  15 mL Mouth Rinse 10 times per day  . pantoprazole (PROTONIX) IV  40 mg Intravenous Q24H  . polyethylene glycol  17 g Per Tube Daily   Continuous Infusions: . sodium chloride 100 mL/hr at 02/05/21 0753  . cefTRIAXone (ROCEPHIN)  IV 1 g (02/05/21 1127)  . metronidazole 500 mg (02/05/21 0755)  . propofol (DIPRIVAN) infusion 25 mcg/kg/min (02/05/21 0751)   PRN Meds:.acetaminophen **OR** acetaminophen, albuterol, bisacodyl, fentaNYL (SUBLIMAZE) injection, hydrALAZINE, ondansetron **OR** ondansetron (ZOFRAN) IV    Interim History / Subjective:  Pt appears uncomfortable on vent, claps when told plan to extubate   Objective   Blood pressure (!) 159/141, pulse 87, temperature 98.8 F (37.1 C), temperature source Axillary, resp. rate 17, height $RemoveBe'5\' 6"'AaHZoHPXo$  (1.676 m), weight 106 kg, SpO2 96 %.    Vent Mode: PRVC FiO2 (%):  [40 %-100 %]  40 % Set Rate:  [18 bmp] 18 bmp Vt Set:  [510 mL] 510 mL PEEP:  [5 cmH20] 5 cmH20 Plateau Pressure:  [16 cmH20-29 cmH20] 20 cmH20   Intake/Output Summary (Last 24 hours) at 02/05/2021 1201 Last data filed at 02/05/2021 0759 Gross per 24 hour  Intake 2549.15 ml  Output 1900 ml  Net 649.15 ml   Filed Weights   02/05/21 0332  Weight: 106 kg    Examination: Tmax 99 General: mod obese wm/ sedated on vent on diprovan drip HENT: oral et  Lungs:  Insp/exp rhonchi/ secretions were  copious, beige but less over the day Cardiovascular: RRR no s3 Abdomen: mod obese but soft, nl pb Extremities: warm/ PAS no edema  Neuro: claps appropriately, moves all 4 spont      I personally reviewed images and agree with radiology impression as follows:  CXR:   5/6 portable 1. Increasing airspace opacities throughout the right lung may reflect asymmetric edema versus pneumonia. 2. Stable lines and tubes, as above.        Assessment & Plan:  1) acute respiratory failure with hypoxemia s/p intaoperative aspiration 5/5  - gas exchange/ mechanics ok for trial extubation this afternoon - otherwise will need higher level of sedation and repeat on Saturday with PCCM not available on site but of course E link can monitor and we can be reached as below  2) doubt infection caused by aspiration which is more of a pneumonitis and he is probably going to be better at coughing up secretions once sedation d/c'd than what we can do with suctioning   >>> d/c diprovan and extubate / keep npo until sure he's alert enough to eat again.      Labs   CBC: Recent Labs  Lab 02/04/21 1536 02/05/21 0456  WBC 12.8* 22.4*  NEUTROABS 10.7* 20.0*  HGB 14.8 13.9  HCT 46.5 44.1  MCV 86.6 87.7  PLT 294 948    Basic Metabolic Panel: Recent Labs  Lab 02/04/21 1536 02/05/21 0456  NA 138 139  K 4.0 3.7  CL 100 103  CO2 26 25  GLUCOSE 163* 154*  BUN 16 20  CREATININE 0.68 0.83  CALCIUM 9.2 9.5  MG 2.0 2.0  PHOS 3.6 3.5   GFR: Estimated Creatinine Clearance: 112.1 mL/min (by C-G formula based on SCr of 0.83 mg/dL). Recent Labs  Lab 02/04/21 1536 02/05/21 0456  WBC 12.8* 22.4*    Liver Function Tests: Recent Labs  Lab 02/04/21 1536 02/05/21 0456  AST 48* 32  ALT 53* 40  ALKPHOS 108 92  BILITOT 0.7 0.5  PROT 8.5* 7.6  ALBUMIN 4.4 3.8   No results for input(s): LIPASE, AMYLASE in the last 168 hours. No results for input(s): AMMONIA in the last 168 hours.  ABG     Component Value Date/Time   PHART 7.372 02/04/2021 1620   PCO2ART 48.0 02/04/2021 1620   PO2ART 87.6 02/04/2021 1620   HCO3 26.0 02/04/2021 1620   O2SAT 96.0 02/04/2021 1620     Coagulation Profile: No results for input(s): INR, PROTIME in the last 168 hours.  Cardiac Enzymes: No results for input(s): CKTOTAL, CKMB, CKMBINDEX, TROPONINI in the last 168 hours.  HbA1C: No results found for: HGBA1C  CBG: Recent Labs  Lab 02/05/21 0743  GLUCAP 156*       Past Medical History:  He,  has a past medical history of Blind, GERD (gastroesophageal reflux disease), Hyperlipidemia, Hypertension, Hypoxic brain injury ([place]), Muscle spasms of head  and/or neck, and Obesity.   Surgical History:   Past Surgical History:  Procedure Laterality Date  . CHOLECYSTECTOMY    . CYSTOSCOPY W/ URETERAL STENT PLACEMENT Right 12/14/2020   Procedure: CYSTOSCOPY WITH RETROGRADE PYELOGRAM/URETERAL STENT PLACEMENT;  Surgeon: Robley Fries, MD;  Location: [facility];  Service: Urology;  Laterality: Right;  . ESOPHAGOGASTRODUODENOSCOPY N/A 01/04/2013   VZD:GLOVF hiatal hernia/GASTRIC PolypS/MODERATE Non-erosive gastritis/SMALL nodule in the duodenal bulb  . high pressure shunt    . MALONEY DILATION N/A 01/04/2013   Procedure: MALONEY DILATION;  Surgeon: Danie Binder, MD;  Location: [facility];  Service: Endoscopy;  Laterality: N/A;  . MASS EXCISION Left 09/12/2013   Procedure: EXCISION NEOPLASM LEFT SHOULDER;  Surgeon: Scherry Ran, MD;  Location: [facility];  Service: General;  Laterality: Left;  . SAVORY DILATION N/A 01/04/2013   Procedure: SAVORY DILATION;  Surgeon: Danie Binder, MD;  Location: [facility];  Service: Endoscopy;  Laterality: N/A;     Social History:   reports that he has never smoked. He has never used smokeless tobacco. He reports that he does not drink alcohol and does not use drugs.   Family History:  His family history includes Breast cancer in his mother and sister; Kidney  failure in his father.   Allergies Allergies  Allergen Reactions  . Lisinopril     Other reaction(s): "made me loopy"  . Penicillins Hives     Home Medications  Prior to Admission medications   Medication Sig Start Date End Date Taking? Authorizing Provider  acetaminophen (TYLENOL) 500 MG tablet Take 1,000 mg by mouth every 8 (eight) hours as needed for mild pain.   Yes [provider]  amLODipine (NORVASC) 10 MG tablet Take 10 mg by mouth daily with breakfast.   Yes [provider]  aspirin EC 81 MG tablet Take 81 mg by mouth daily with breakfast. Swallow whole.   Yes [provider]  atorvastatin (LIPITOR) 40 MG tablet Take 40 mg by mouth at bedtime. 12/05/12  Yes [provider]  cholecalciferol (VITAMIN D3) 25 MCG (1000 UNIT) tablet Take 1,000 Units by mouth daily with breakfast.   Yes [provider]  cyclobenzaprine (FLEXERIL) 5 MG tablet Take 5 mg by mouth 3 (three) times daily. 12/10/20  Yes [provider]  escitalopram (LEXAPRO) 10 MG tablet Take 10 mg by mouth at bedtime.   Yes [provider]  esomeprazole (NEXIUM) 40 MG capsule Take 40 mg by mouth daily with breakfast.   Yes [provider]  HYDROcodone-acetaminophen (NORCO) 5-325 MG tablet Take 1 tablet by mouth every 6 (six) hours as needed for moderate pain. 01/05/21  Yes McKenzie, Candee Furbish, MD  magnesium oxide (MAG-OX) 400 MG tablet Take 400 mg by mouth daily with breakfast. 11/04/20  Yes [provider]  meclizine (ANTIVERT) 25 MG tablet Take 25 mg by mouth every 6 (six) hours as needed for dizziness.   Yes [provider]  Multiple Vitamin (MULTIVITAMIN WITH MINERALS) TABS tablet Take 1 tablet by mouth daily with breakfast.   Yes [provider]  naproxen (NAPROSYN) 500 MG tablet Take 500 mg by mouth every 12 (twelve) hours. 12/24/20  Yes [provider]  potassium chloride (K-DUR) 10 MEQ tablet Take 10 mEq by mouth daily  with breakfast. 12/05/12  Yes [provider]  propranolol (INDERAL) 40 MG tablet Take 40 mg by mouth 2 (two) times daily. 12/24/20  Yes [provider]  riboflavin (VITAMIN B-2) 100  MG TABS tablet Take 400 mg by mouth daily with breakfast. 12/09/20  Yes [provider]  SUMAtriptan (IMITREX) 100 MG tablet Take 100 mg by mouth See admin instructions. Take 100mg  at onset of migraine. If no relief, may repeat in 2 hours. Max 2 doses in 24 hours 12/24/20  Yes [provider]       The patient is critically ill with multiple organ systems failure and requires high complexity decision making for assessment and support, frequent evaluation and titration of therapies, application of advanced monitoring technologies and extensive interpretation of multiple databases. Critical Care Time devoted to patient care services described in this note is 35 minutes.    Christinia Gully, MD Pulmonary and [place] [phone]   After 7:00 pm call Elink  [phone]

## 2021-02-05 NOTE — Progress Notes (Signed)
OT Cancellation Note  Patient Details Name: [name] MRN: [id number] DOB: 05/15/1961   Cancelled Treatment:    Reason Eval/Treat Not Completed: Patient not medically ready. Nursing reported that pt is currently intubated and not medically ready for therapy. Will attempt evaluation later as time allows.   Naylea Wigington OT, MOT   Larey Seat 02/05/2021, 12:47 PM

## 2021-02-05 NOTE — Procedures (Signed)
Extubation Procedure Note    Patient was able to perform a -57 NIF, Dr. Melvyn Novas stated VC not needed. Patient's mouth and airway were suctioned prior to extubation. Patient Details:   Name: [name] DOB: January 26, 1961 MRN: [id number]   Airway Documentation:    Vent end date: 02/05/21 Vent end time: 1300  Evaluation  O2 sats: stable throughout Complications: No apparent complications Patient did tolerate procedure well. Bilateral Breath Sounds: Diminished,Rhonchi   Yes, patient was able to speak post extubation. He was placed on a 4L/min [place] with an SpO2 of 93%. RT will continue to monitor patient.  Pete Pelt 02/05/2021, 1:30 PM

## 2021-02-05 NOTE — Progress Notes (Signed)
PT Cancellation Note  Patient Details Name: [name] MRN: [id number] DOB: 1961/03/03   Cancelled Treatment:    Reason Eval/Treat Not Completed: Medical issues which prohibited therapy. Patient still intubated, will check back if patient extubated today and can tolerated therapy.  11:20 AM, 02/05/21 Lonell Grandchild, MPT Physical Therapist with [facility] [phone] office [phone] mobile phone

## 2021-02-05 NOTE — Progress Notes (Signed)
Initial Nutrition Assessment  DOCUMENTATION CODES:   Obesity unspecified  INTERVENTION:   -If unable to extubate within 24-48 hours, recommend:  Initiate Vital High Protein @ 30 ml/hr via OGT (720 ml daily)  90 ml Prosource TF TID   Tube feeding regimen provides 960 kcal (82% of needs), 129 grams of protein, and 602 ml of H2O.   TF + propofol to provide 1372 kcals daily  NUTRITION DIAGNOSIS:   Inadequate oral intake related to inability to eat as evidenced by NPO status.  GOAL:   Provide needs based on ASPEN/SCCM guidelines  MONITOR:   Vent status,Labs,Weight trends,I & O's,Skin  REASON FOR ASSESSMENT:   Ventilator    ASSESSMENT:   [name] is a 60 y.o. male with medical history significant for but not limited too blindness, history of GERD, hyperlipidemia, hypertension, history of hypoxic brain injury at the age of 73, history of muscle spasms, obesity as well as other comorbidities including nephrolithiasis who presented today for cystoscopy with retrograde pyelogram and ureteral stent exchange as well as a Foley and laser application on the right for his right ureteral calculus and nephrolithiasis  Pt admitted with acute respiratory failure with hypoxia secondary to aspiration.   Patient is currently intubated on ventilator support MV: 9.4 L/min Temp (24hrs), Avg:97.6 F (36.4 C), Min:96 F (35.6 C), Max:98.8 F (37.1 C)  Propofol: 15.59 ml/hr (provides 412 kcals daily)  MAP: 64  Reviewed I/O's: -80 ml x 24 hours   UOP: 1.5 L x 24 hours  NGT output: 100 ml x 24 hours  Case discussed with RN, who reports pt is more calm today. SHe reports that they are starting weaning trials with pt, with possible plan for extubation today.   Spoke with pt mother at bedside, who reports no nutrition-related concerns. Pt had a good appetite PTA and denies that has lost weight.   Case discussed with MD. No plans for TF today. Plan to extubate within the next 24  hours.   Medications reviewed and include colace, miralax, 0.9% sodium chloride infusion @ 100 ml/hr.   Labs reviewed: CBGS: 156.  NUTRITION - FOCUSED PHYSICAL EXAM:  Flowsheet Row Most Recent Value  Orbital Region No depletion  Upper Arm Region No depletion  Thoracic and Lumbar Region No depletion  Buccal Region No depletion  Temple Region No depletion  Clavicle Bone Region No depletion  Clavicle and Acromion Bone Region No depletion  Scapular Bone Region No depletion  Dorsal Hand No depletion  Patellar Region No depletion  Anterior Thigh Region No depletion  Posterior Calf Region No depletion  Edema (RD Assessment) Mild  Hair Reviewed  Eyes Reviewed  Mouth Reviewed  Skin Reviewed  Nails Reviewed       Diet Order:   Diet Order            Diet NPO time specified  Diet effective now                 EDUCATION NEEDS:   Not appropriate for education at this time  Skin:  Skin Assessment: Reviewed RN Assessment  Last BM:  Unknown  Height:   Ht Readings from Last 1 Encounters:  02/04/21 5\' 6"  (1.676 m)    Weight:   Wt Readings from Last 1 Encounters:  02/05/21 106 kg    Ideal Body Weight:  64.5 kg  BMI:  Body mass index is 37.72 kg/m.  Estimated Nutritional Needs:   Kcal:  4098-1191  Protein:  > 129  grams  Fluid:  > 1.2 L    Loistine Chance, RD, LDN, [place] Registered Dietitian II Certified Diabetes Care and Education Specialist Please refer to [facility] for RD and/or RD on-call/weekend/after hours pager

## 2021-02-05 NOTE — Care Management Important Message (Signed)
Important Message  Patient Details  Name: [name] MRN: [id number] Date of Birth: [birth year]   Medicare Important Message Given:  Yes     [name] 02/05/2021, 12:01 PM

## 2021-02-05 NOTE — Plan of Care (Signed)
  Problem: Clinical Measurements: Goal: Will remain free from infection Outcome: Progressing Goal: Diagnostic test results will improve Outcome: Progressing Goal: Respiratory complications will improve Outcome: Progressing   Problem: Safety: Goal: Ability to remain free from injury will improve Outcome: Progressing   Problem: Skin Integrity: Goal: Risk for impaired skin integrity will decrease Outcome: Progressing   

## 2021-02-05 NOTE — Progress Notes (Signed)
Performed [facility] bedside swallow screen on patient as patient had PO meds to give. Patient passed and had no difficulty swallowing meds. Will make MD aware, will continue to monitor.

## 2021-02-05 NOTE — Progress Notes (Addendum)
Patient Demographics:    [name], is a 60 y.o. male, DOB - [birth year], [id number]  Admit date - 02/04/2021   Admitting Physician Kerney Elbe, DO  Outpatient Primary MD for the patient is [name]  LOS - 1   No chief complaint on file.       Subjective:    [name] today has no fevers-- -mother at bedside -Patient did well with sedation vacation and CPAP trial -Subsequently extubated at 1 PM  Assessment  & Plan :    Principal Problem:   Severe sepsis with hypoxic respiratory failure due to aspiration pneumonia Active Problems:   Intraoperative acute respiratory failure   Blindness   History of anoxic brain injury at age 27   Right ureteral calculus   Essential hypertension   Aspiration into airway    Brief Summary:- 60 y.o. male with medical history significant for but not limited too blindness, history of GERD, hyperlipidemia, hypertension, history of hypoxic brain injury at the age of 65, history of muscle spasms, obesity as well as other comorbidities including nephrolithiasis admitted on 02/04/2021 after aspiration episode while undergoing induction of anesthesia for urological procedure leading to acute hypoxic respiratory failure requiring intubation  A/p 1)Acute hypoxic respiratory failure requiring intubation due to aspiration pneumonia -Intubated 02/04/2021 --Patient did well with sedation vacation and CPAP trial -Subsequently extubated at 1 PM -PCCM consult from Dr. Melvyn Novas appreciated -Currently on 4 L of oxygen via nasal cannula postextubation -Continue mucolytics and bronchodilators  2) severe sepsis secondary to aspiration pneumonia--patient meets sepsis criteria with tachycardia, tachypnea and leukocytosis and aspiration pneumonia -Currently on Rocephin and Flagyl -Urine cultures pending -Hypoxia improving as above #1, sepsis  pathophysiology appears to be improving  3)GERD--continue Protonix  4)Ureteral Obstructive Nephrolithiasis-- -Patient underwent a ureteral stent placement on 12/04/2020 for a 5 mm distal ureteral calculus with UTI.  He started having right flank pain with a stent in place and urology discussed the management of kidney stones and the plan is for a right ureteroscopic stone extraction.  cystoscopy with retrograde pyelogram and ureteral stent exchange as well as -patient was scheduled for Foley and laser application on the right for his right ureteral calculus and nephrolithiasis on 02/04/2021--- procedure was canceled due to aspiration pneumonia, urologist will reevaluate on Monday, 02/08/2021 to see if procedure can be done -Continue IV Rocephin pending urine culture  5)HTN/HLD--- amlodipine for BP control, atorvastatin for HLD  6)Weakness and deconditioning--- get PT eval  7)History of anoxic brain injury at age 4--- patient has baseline cognitive/neuropsychiatric deficits -Patient stays at a group home, his mother checks on him  CRITICAL CARE Performed by: Roxan Hockey   Total critical care time: 42 minutes  Critical care time was exclusive of separately billable procedures and treating other patients.  Ventilator settings adjusted- -Patient did well with sedation vacation and CPAP trial -Subsequently extubated at 1 PM -Currently on nasal cannula at 4 L  Critical care was necessary to treat or prevent imminent or life-threatening deterioration.  Critical care was time spent personally by me on the following activities: development of treatment plan with patient and/or surrogate as well as nursing, discussions with consultants, evaluation of patient's response to treatment, examination of patient, obtaining  history from patient or surrogate, ordering and performing treatments and interventions, ordering and review of laboratory studies, ordering and review of radiographic studies, pulse  oximetry and re-evaluation of patient's condition.    Disposition/Need for in-Hospital Stay- patient unable to be discharged at this time due to -acute hypoxic respiratory failure requiring intubation, subsequent extubation, currently requiring antibiotics for aspiration pneumonia and sepsis, requiring IV fluids, awaiting tolerance of oral intake*  Status is: Inpatient  Remains inpatient appropriate because:Please see disposition above   Disposition: The patient is from: Group home              Anticipated d/c is to: Group home              Anticipated d/c date is: 3 days              Patient currently is not medically stable to d/c. Barriers: Not Clinically Stable-   Code Status : -  Code Status: Full Code   Family Communication:   Discussed with patient's mother at bedside  Procedures:-  Intubated 02/04/21 Extubated 02/05/21  Consults  :  PCCM  DVT Prophylaxis  :   - SCDs  heparin injection 5,000 Units Start: 02/04/21 1800 SCDs Start: 02/04/21 1457    Lab Results  Component Value Date   PLT 345 02/05/2021    Inpatient Medications  Scheduled Meds: . amLODipine  5 mg Oral Daily  . atorvastatin  40 mg Per Tube QHS  . chlorhexidine  15 mL Mouth/Throat BID  . Chlorhexidine Gluconate Cloth  6 each Topical Daily  . escitalopram  10 mg Per Tube QHS  . guaiFENesin  600 mg Oral BID  . heparin  5,000 Units Subcutaneous Q8H  . ipratropium-albuterol  3 mL Nebulization Q6H  . mouth rinse  15 mL Mouth Rinse BID  . pantoprazole (PROTONIX) IV  40 mg Intravenous Q24H  . polyethylene glycol  17 g Per Tube Daily  . senna-docusate  2 tablet Oral QHS   Continuous Infusions: . sodium chloride 75 mL/hr at 02/05/21 1345  . cefTRIAXone (ROCEPHIN)  IV Stopped (02/05/21 1157)  . metronidazole Stopped (02/05/21 0855)   PRN Meds:.acetaminophen **OR** acetaminophen, albuterol, bisacodyl, hydrALAZINE, ondansetron **OR** ondansetron (ZOFRAN) IV    Anti-infectives (From admission, onward)    Start     Dose/Rate Route Frequency Ordered Stop   02/05/21 1200  cefTRIAXone (ROCEPHIN) 1 g in sodium chloride 0.9 % 100 mL IVPB        1 g 200 mL/hr over 30 Minutes Intravenous Every 24 hours 02/04/21 1536     02/04/21 1630  metroNIDAZOLE (FLAGYL) IVPB 500 mg        500 mg 100 mL/hr over 60 Minutes Intravenous Every 8 hours 02/04/21 1536     02/04/21 1245  cefTRIAXone (ROCEPHIN) 2 g in sodium chloride 0.9 % 100 mL IVPB        2 g 200 mL/hr over 30 Minutes Intravenous 30 min pre-op 02/04/21 1233 02/04/21 1354   02/04/21 1230  sodium chloride 0.9 % with cefTRIAXone (ROCEPHIN) ADS Med       Note to Pharmacy: Hinton Rao   : cabinet override      02/04/21 1230 02/04/21 1340        Objective:   Vitals:   02/05/21 1300 02/05/21 1306 02/05/21 1311 02/05/21 1330  BP: (!) 157/83   112/69  Pulse: 88  89 85  Resp: (!) 25  19 17   Temp:  99.4 F (37.4 C)  TempSrc:  Oral    SpO2: 97%  93% 94%  Weight:      Height:        Wt Readings from Last 3 Encounters:  02/05/21 106 kg  02/02/21 103.9 kg  01/05/21 99.3 kg     Intake/Output Summary (Last 24 hours) at 02/05/2021 1353 Last data filed at 02/05/2021 1251 Gross per 24 hour  Intake 1726.55 ml  Output 1900 ml  Net -173.45 ml   Physical Exam  Gen:- Awake Alert,  In no apparent distress  HEENT:- [place].AT, No sclera icterus Nose -[place] 4L/min Neck-Supple Neck,No JVD,.  Lungs-diminished in bases, scattered rhonchi especially on the right  CV- S1, S2 normal, regular  Abd-  +ve B.Sounds, Abd Soft, No tenderness,    Extremity/Skin:- No  edema, pedal pulses present  NeuroPsych--- baseline neuropsychiatric  deficits due to anoxic brain injury at age 69   Data Review:   Micro Results Recent Results (from the past 240 hour(s))  SARS Coronavirus 2 by RT PCR (hospital order, performed in [facility] hospital lab) Nasopharyngeal Nasopharyngeal Swab     Status: None   Collection Time: 02/04/21 10:14 AM   Specimen: Nasopharyngeal Swab   Result Value Ref Range Status   SARS Coronavirus 2 NEGATIVE NEGATIVE Final    Comment: (NOTE) SARS-CoV-2 target nucleic acids are NOT DETECTED.  The SARS-CoV-2 RNA is generally detectable in upper and lower respiratory specimens during the acute phase of infection. The lowest concentration of SARS-CoV-2 viral copies this assay can detect is 250 copies / mL. A negative result does not preclude SARS-CoV-2 infection and should not be used as the sole basis for treatment or other patient management decisions.  A negative result may occur with improper specimen collection / handling, submission of specimen other than nasopharyngeal swab, presence of viral mutation(s) within the areas targeted by this assay, and inadequate number of viral copies (<250 copies / mL). A negative result must be combined with clinical observations, patient history, and epidemiological information.  Fact Sheet for Patients:   StrictlyIdeas.no  Fact Sheet for Healthcare Providers: BankingDealers.co.za  This test is not yet approved or  cleared by the [place] FDA and has been authorized for detection and/or diagnosis of SARS-CoV-2 by FDA under an Emergency Use Authorization (EUA).  This EUA will remain in effect (meaning this test can be used) for the duration of the COVID-19 declaration under Section 564(b)(1) of the Act, 21 U.S.C. section 360bbb-3(b)(1), unless the authorization is terminated or revoked sooner.  Performed at [facility], [address], [place], [place] [address]   MRSA PCR Screening     Status: None   Collection Time: 02/04/21  3:16 PM   Specimen: Nasal Mucosa; Nasopharyngeal  Result Value Ref Range Status   MRSA by PCR NEGATIVE NEGATIVE Final    Comment:        The GeneXpert MRSA Assay (FDA approved for NASAL specimens only), is one component of a comprehensive MRSA colonization surveillance program. It is not intended to  diagnose MRSA infection nor to guide or monitor treatment for MRSA infections. Performed at [facility], [address] [place],  [address]     Radiology Reports DG CHEST PORT 1 VIEW  Result Date: 02/05/2021 CLINICAL DATA:  Shortness of breath EXAM: PORTABLE CHEST 1 VIEW COMPARISON:  02/04/2021 FINDINGS: Endotracheal tube terminates 3.6 cm above the carina. Enteric tube remains appropriately position within the gastric body. Stable heart size. Atherosclerotic calcification of the aortic knob. Increasing airspace opacities throughout  the right lung. No pleural effusion or pneumothorax. IMPRESSION: 1. Increasing airspace opacities throughout the right lung may reflect asymmetric edema versus pneumonia. 2. Stable lines and tubes, as above. Electronically Signed   By: Davina Poke D.O.   On: 02/05/2021 08:36   DG CHEST PORT 1 VIEW  Result Date: 02/04/2021 CLINICAL DATA:  Hypoxia EXAM: PORTABLE CHEST 1 VIEW COMPARISON:  December 13, 2020 FINDINGS: Endotracheal tube tip is 5.1 cm above the carina. Nasogastric tube tip and side port in stomach. No pneumothorax. There is mild atelectatic change in each mid lung. Lungs elsewhere clear. Heart size and pulmonary vascularity are normal. No adenopathy. No bone lesions. Note that ventricular shunt catheter extends along the right hemithorax, stable. IMPRESSION: Tube positions as described without pneumothorax. Midlung atelectasis bilaterally. Lungs otherwise clear. Cardiac silhouette within normal limits. Electronically Signed   By: Lowella Grip III M.D.   On: 02/04/2021 14:53     CBC Recent Labs  Lab 02/04/21 1536 02/05/21 0456  WBC 12.8* 22.4*  HGB 14.8 13.9  HCT 46.5 44.1  PLT 294 345  MCV 86.6 87.7  MCH 27.6 27.6  MCHC 31.8 31.5  RDW 15.2 15.4  LYMPHSABS 1.6 1.3  MONOABS 0.2 0.6  EOSABS 0.1 0.3  BASOSABS 0.1 0.1    Chemistries  Recent Labs  Lab 02/04/21 1536 02/05/21 0456  NA 138 139  K 4.0 3.7  CL 100 103  CO2 26 25   GLUCOSE 163* 154*  BUN 16 20  CREATININE 0.68 0.83  CALCIUM 9.2 9.5  MG 2.0 2.0  AST 48* 32  ALT 53* 40  ALKPHOS 108 92  BILITOT 0.7 0.5   ------------------------------------------------------------------------------------------------------------------ Recent Labs    02/05/21 0456  TRIG 252*    No results found for: HGBA1C ------------------------------------------------------------------------------------------------------------------ No results for input(s): TSH, T4TOTAL, T3FREE, THYROIDAB in the last 72 hours.  Invalid input(s): FREET3 ------------------------------------------------------------------------------------------------------------------ No results for input(s): VITAMINB12, FOLATE, FERRITIN, TIBC, IRON, RETICCTPCT in the last 72 hours.  Coagulation profile No results for input(s): INR, PROTIME in the last 168 hours.  No results for input(s): DDIMER in the last 72 hours.  Cardiac Enzymes No results for input(s): CKMB, TROPONINI, MYOGLOBIN in the last 168 hours.  Invalid input(s): CK ------------------------------------------------------------------------------------------------------------------ No results found for: BNP   Roxan Hockey M.D on 02/05/2021 at 1:53 PM  Go to [email or web address] - for contact info  [facility] - Office  [phone]

## 2021-02-05 NOTE — Progress Notes (Signed)
Pt extubated at 1300 by RT successfully. Placed on 4L [place]. Sat's stable in the upper 90% range. Will continue to monitor.

## 2021-02-06 ENCOUNTER — Inpatient Hospital Stay (HOSPITAL_COMMUNITY): Payer: Medicare Other

## 2021-02-06 ENCOUNTER — Other Ambulatory Visit: Payer: Self-pay

## 2021-02-06 DIAGNOSIS — A419 Sepsis, unspecified organism: Secondary | ICD-10-CM | POA: Diagnosis not present

## 2021-02-06 DIAGNOSIS — R6520 Severe sepsis without septic shock: Secondary | ICD-10-CM | POA: Diagnosis not present

## 2021-02-06 LAB — CBC
HCT: 34.6 % — ABNORMAL LOW (ref 39.0–52.0)
Hemoglobin: 11 g/dL — ABNORMAL LOW (ref 13.0–17.0)
MCH: 27.8 pg (ref 26.0–34.0)
MCHC: 31.8 g/dL (ref 30.0–36.0)
MCV: 87.4 fL (ref 80.0–100.0)
Platelets: 268 10*3/uL (ref 150–400)
RBC: 3.96 MIL/uL — ABNORMAL LOW (ref 4.22–5.81)
RDW: 15.6 % — ABNORMAL HIGH (ref 11.5–15.5)
WBC: 12.6 10*3/uL — ABNORMAL HIGH (ref 4.0–10.5)
nRBC: 0 % (ref 0.0–0.2)

## 2021-02-06 LAB — BASIC METABOLIC PANEL
Anion gap: 7 (ref 5–15)
BUN: 21 mg/dL — ABNORMAL HIGH (ref 6–20)
CO2: 28 mmol/L (ref 22–32)
Calcium: 8.7 mg/dL — ABNORMAL LOW (ref 8.9–10.3)
Chloride: 105 mmol/L (ref 98–111)
Creatinine, Ser: 0.75 mg/dL (ref 0.61–1.24)
GFR, Estimated: >60 mL/min (ref >60)
Glucose, Bld: 119 mg/dL — ABNORMAL HIGH (ref 70–99)
Potassium: 3.4 mmol/L — ABNORMAL LOW (ref 3.5–5.1)
Sodium: 140 mmol/L (ref 135–145)

## 2021-02-06 MED ORDER — POTASSIUM CHLORIDE 20 MEQ PO PACK
40.0000 meq | PACK | Freq: Once | ORAL | Status: AC
  Administered 2021-02-06: 40 meq via ORAL
  Filled 2021-02-06: qty 2

## 2021-02-06 NOTE — Progress Notes (Signed)
Patient Demographics:    [name], is a 60 y.o. male, DOB - 1961/06/14, VHQ:[id number]  Admit date - 02/04/2021   Admitting Physician Kerney Elbe, DO  Outpatient Primary MD for the patient is [facility]  LOS - 2   No chief complaint on file.       Subjective:    [name] today has no fevers-- -mother at bedside -Awake, no new concerns, tolerating oral intake -No BM yet RN Lawrence at bedside  Assessment  & Plan :    Principal Problem:   Severe sepsis with hypoxic respiratory failure due to aspiration pneumonia Active Problems:   Intraoperative acute respiratory failure   Blindness   History of anoxic brain injury at age 45   Right ureteral calculus   Essential hypertension   Aspiration into airway    Brief Summary:- 60 y.o. male with medical history significant for but not limited too blindness, history of GERD, hyperlipidemia, hypertension, history of hypoxic brain injury at the age of 89, history of muscle spasms, obesity as well as other comorbidities including nephrolithiasis admitted on 02/04/2021 after aspiration episode while undergoing induction of anesthesia for urological procedure leading to acute hypoxic respiratory failure requiring intubation  A/p 1)Acute hypoxic respiratory failure requiring intubation due to aspiration pneumonia -Intubated 02/04/2021 --Patient did well with sedation vacation and CPAP trial -Subsequently extubated at 1 PM on 02/05/21 -PCCM consult from Dr. Melvyn Novas appreciated -Weaned off oxygen as of 02/06/2021 -Continue mucolytics and bronchodilators -Hypoxia has resolved -Repeat chest x-ray on 02/06/2021 showed improved aeration and improving pneumonia  2) severe sepsis secondary to aspiration pneumonia--patient met sepsis criteria on admission with tachycardia, tachypnea and leukocytosis and aspiration pneumonia -Currently on  Rocephin and Flagyl -Urine cultures pending  sepsis pathophysiology appears to be improving -Please see #1 above  3)GERD--continue Protonix  4)Ureteral Obstructive Nephrolithiasis-- -Patient underwent a ureteral stent placement on 12/04/2020 for a 5 mm distal ureteral calculus with UTI.  He started having right flank pain with a stent in place and urology discussed the management of kidney stones and the plan is for a right ureteroscopic stone extraction.  cystoscopy with retrograde pyelogram and ureteral stent exchange as well as -patient was scheduled for Foley and laser application on the right for his right ureteral calculus and nephrolithiasis on 02/04/2021--- procedure was canceled due to aspiration pneumonia, urologist will reevaluate on Monday, 02/08/2021 to see if procedure can be done -Continue IV Rocephin pending urine culture  5)HTN/HLD--- amlodipine for BP control, atorvastatin for HLD  6)Weakness and deconditioning--- awaiting PT eval  7)History of anoxic brain injury at age 73--- patient has baseline cognitive/neuropsychiatric deficits and vision loss -Patient stays at a group home, his mother checks on him   Disposition/Need for in-Hospital Stay- patient unable to be discharged at this time due to -aspiration pneumonia requiring IV antibiotics, nephrolithiasis requiring urological intervention  Status is: Inpatient  Remains inpatient appropriate because:Please see disposition above   Disposition: The patient is from: Group home              Anticipated d/c is to: Group home              Anticipated d/c date is: 3 days  Patient currently is not medically stable to d/c. Barriers: Not Clinically Stable-   Code Status : -  Code Status: Full Code   Family Communication:   Discussed with patient's mother at bedside  Procedures:-  Intubated 02/04/21 Extubated 02/05/21 Foley and laser application on the right for his right ureteral calculus and nephrolithiasis  planned for  02/08/2021  Consults  :  PCCM  DVT Prophylaxis  :   - SCDs----we need to hold heparin p.m. of 02/07/2021 to allow for identical procedure on 02/08/2021 -  heparin injection 5,000 Units Start: 02/04/21 1800 SCDs Start: 02/04/21 1457   Lab Results  Component Value Date   PLT 268 02/06/2021    Inpatient Medications  Scheduled Meds: . amLODipine  5 mg Oral Daily  . atorvastatin  40 mg Per Tube QHS  . chlorhexidine  15 mL Mouth/Throat BID  . Chlorhexidine Gluconate Cloth  6 each Topical Daily  . escitalopram  10 mg Per Tube QHS  . guaiFENesin  600 mg Oral BID  . heparin  5,000 Units Subcutaneous Q8H  . ipratropium-albuterol  3 mL Nebulization TID  . mouth rinse  15 mL Mouth Rinse BID  . pantoprazole (PROTONIX) IV  40 mg Intravenous Q24H  . polyethylene glycol  17 g Per Tube Daily  . senna-docusate  2 tablet Oral QHS   Continuous Infusions: . sodium chloride 30 mL/hr at 02/06/21 1307  . cefTRIAXone (ROCEPHIN)  IV 1 g (02/06/21 1322)  . metronidazole 500 mg (02/06/21 0832)   PRN Meds:.acetaminophen **OR** acetaminophen, albuterol, bisacodyl, hydrALAZINE, ondansetron **OR** ondansetron (ZOFRAN) IV    Anti-infectives (From admission, onward)   Start     Dose/Rate Route Frequency Ordered Stop   02/05/21 1200  cefTRIAXone (ROCEPHIN) 1 g in sodium chloride 0.9 % 100 mL IVPB        1 g 200 mL/hr over 30 Minutes Intravenous Every 24 hours 02/04/21 1536     02/04/21 1630  metroNIDAZOLE (FLAGYL) IVPB 500 mg        500 mg 100 mL/hr over 60 Minutes Intravenous Every 8 hours 02/04/21 1536     02/04/21 1245  cefTRIAXone (ROCEPHIN) 2 g in sodium chloride 0.9 % 100 mL IVPB        2 g 200 mL/hr over 30 Minutes Intravenous 30 min pre-op 02/04/21 1233 02/04/21 1354   02/04/21 1230  sodium chloride 0.9 % with cefTRIAXone (ROCEPHIN) ADS Med       Note to Pharmacy: Hinton Rao   : cabinet override      02/04/21 1230 02/04/21 1340        Objective:   Vitals:   02/06/21 1132  02/06/21 1200 02/06/21 1300 02/06/21 1400  BP:  133/68 138/64 (!) 147/61  Pulse: (!) 55 (!) 54 (!) 59 60  Resp: 15 20 (!) 23 19  Temp: 98 F (36.7 C)     TempSrc: Oral     SpO2: 93% 90% 93% 93%  Weight:      Height:        Wt Readings from Last 3 Encounters:  02/06/21 106.5 kg  02/02/21 103.9 kg  01/05/21 99.3 kg     Intake/Output Summary (Last 24 hours) at 02/06/2021 1407 Last data filed at 02/06/2021 1400 Gross per 24 hour  Intake 2787.86 ml  Output 975 ml  Net 1812.86 ml   Physical Exam  Gen:- Awake Alert,  In no apparent distress  HEENT:- [place].AT, No sclera icterus, vision loss (not new) Neck-Supple Neck,No JVD,.  Lungs-improved air movement, few scattered rhonchi, no wheezing CV- S1, S2 normal, regular  Abd-  +ve B.Sounds, Abd Soft, No tenderness,    Extremity/Skin:- No  edema, pedal pulses present  NeuroPsych--- baseline neuropsychiatric  deficits due to anoxic brain injury at age 18   Data Review:   Micro Results Recent Results (from the past 240 hour(s))  SARS Coronavirus 2 by RT PCR (hospital order, performed in [facility] hospital lab) Nasopharyngeal Nasopharyngeal Swab     Status: None   Collection Time: 02/04/21 10:14 AM   Specimen: Nasopharyngeal Swab  Result Value Ref Range Status   SARS Coronavirus 2 NEGATIVE NEGATIVE Final    Comment: (NOTE) SARS-CoV-2 target nucleic acids are NOT DETECTED.  The SARS-CoV-2 RNA is generally detectable in upper and lower respiratory specimens during the acute phase of infection. The lowest concentration of SARS-CoV-2 viral copies this assay can detect is 250 copies / mL. A negative result does not preclude SARS-CoV-2 infection and should not be used as the sole basis for treatment or other patient management decisions.  A negative result may occur with improper specimen collection / handling, submission of specimen other than nasopharyngeal swab, presence of viral mutation(s) within the areas targeted by this assay,  and inadequate number of viral copies (<250 copies / mL). A negative result must be combined with clinical observations, patient history, and epidemiological information.  Fact Sheet for Patients:   StrictlyIdeas.no  Fact Sheet for Healthcare Providers: BankingDealers.co.za  This test is not yet approved or  cleared by the [place] FDA and has been authorized for detection and/or diagnosis of SARS-CoV-2 by FDA under an Emergency Use Authorization (EUA).  This EUA will remain in effect (meaning this test can be used) for the duration of the COVID-19 declaration under Section 564(b)(1) of the Act, 21 U.S.C. section 360bbb-3(b)(1), unless the authorization is terminated or revoked sooner.  Performed at [facility], [address] [place], [place] [address]   MRSA PCR Screening     Status: None   Collection Time: 02/04/21  3:16 PM   Specimen: Nasal Mucosa; Nasopharyngeal  Result Value Ref Range Status   MRSA by PCR NEGATIVE NEGATIVE Final    Comment:        The GeneXpert MRSA Assay (FDA approved for NASAL specimens only), is one component of a comprehensive MRSA colonization surveillance program. It is not intended to diagnose MRSA infection nor to guide or monitor treatment for MRSA infections. Performed at [facility], [address], [place], [place] [address]     Radiology Reports DG Chest 2 View  Result Date: 02/06/2021 CLINICAL DATA:  Dyspnea.  History of hypertension. EXAM: CHEST - 2 VIEW COMPARISON:  02/05/2021 and older exams. FINDINGS: Since the prior study, the endotracheal tube and nasal/orogastric tube have been removed. Interstitial and subtle airspace opacities noted in the right lung on the prior study have improved. No new lung abnormalities. No convincing pleural effusion and no pneumothorax. 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 IMPRESSION: 1. Improved lung aeration with a decrease in interstitial  airspace opacities consistent with either improved pneumonia or or edema. 2. No new abnormalities. 3. Status post extubation. Electronically Signed   By: Lajean Manes M.D.   On: 02/06/2021 10:00   DG CHEST PORT 1 VIEW  Result Date: 02/05/2021 CLINICAL DATA:  Shortness of breath EXAM: PORTABLE CHEST 1 VIEW COMPARISON:  02/04/2021 FINDINGS: Endotracheal tube terminates 3.6 cm above the carina. Enteric tube remains appropriately position within the gastric body.  Stable heart size. Atherosclerotic calcification of the aortic knob. Increasing airspace opacities throughout the right lung. No pleural effusion or pneumothorax. IMPRESSION: 1. Increasing airspace opacities throughout the right lung may reflect asymmetric edema versus pneumonia. 2. Stable lines and tubes, as above. Electronically Signed   By: Davina Poke D.O.   On: 02/05/2021 08:36   DG CHEST PORT 1 VIEW  Result Date: 02/04/2021 CLINICAL DATA:  Hypoxia EXAM: PORTABLE CHEST 1 VIEW COMPARISON:  December 13, 2020 FINDINGS: Endotracheal tube tip is 5.1 cm above the carina. Nasogastric tube tip and side port in stomach. No pneumothorax. There is mild atelectatic change in each mid lung. Lungs elsewhere clear. Heart size and pulmonary vascularity are normal. No adenopathy. No bone lesions. Note that ventricular shunt catheter extends along the right hemithorax, stable. IMPRESSION: Tube positions as described without pneumothorax. Midlung atelectasis bilaterally. Lungs otherwise clear. Cardiac silhouette within normal limits. Electronically Signed   By: Lowella Grip III M.D.   On: 02/04/2021 14:53     CBC Recent Labs  Lab 02/04/21 1536 02/05/21 0456 02/06/21 0431  WBC 12.8* 22.4* 12.6*  HGB 14.8 13.9 11.0*  HCT 46.5 44.1 34.6*  PLT 294 345 268  MCV 86.6 87.7 87.4  MCH 27.6 27.6 27.8  MCHC 31.8 31.5 31.8  RDW 15.2 15.4 15.6*  LYMPHSABS 1.6 1.3  --   MONOABS 0.2 0.6  --   EOSABS 0.1 0.3  --   BASOSABS 0.1 0.1  --     Chemistries   Recent Labs  Lab 02/04/21 1536 02/05/21 0456 02/06/21 0431  NA 138 139 140  K 4.0 3.7 3.4*  CL 100 103 105  CO2 _0 GLUCOSE 163* 154* 119*  BUN 16 20 21*  CREATININE 0.68 0.83 0.75  CALCIUM 9.2 9.5 8.7*  MG 2.0 2.0  --   AST 48* 32  --   ALT 53* 40  --   ALKPHOS 108 92  --   BILITOT 0.7 0.5  --    ------------------------------------------------------------------------------------------------------------------ Recent Labs    02/05/21 0456  TRIG 252*    No results found for: HGBA1C ------------------------------------------------------------------------------------------------------------------ No results for input(s): TSH, T4TOTAL, T3FREE, THYROIDAB in the last 72 hours.  Invalid input(s): FREET3 ------------------------------------------------------------------------------------------------------------------ No results for input(s): VITAMINB12, FOLATE, FERRITIN, TIBC, IRON, RETICCTPCT in the last 72 hours.  Coagulation profile No results for input(s): INR, PROTIME in the last 168 hours.  No results for input(s): DDIMER in the last 72 hours.  Cardiac Enzymes No results for input(s): CKMB, TROPONINI, MYOGLOBIN in the last 168 hours.  Invalid input(s): CK ------------------------------------------------------------------------------------------------------------------ No results found for: BNP   Roxan Hockey M.D on 02/06/2021 at 2:07 PM  Go to [email or web address] - for contact info  [facility] - Office  [phone]

## 2021-02-07 DIAGNOSIS — A419 Sepsis, unspecified organism: Secondary | ICD-10-CM | POA: Diagnosis not present

## 2021-02-07 DIAGNOSIS — R6520 Severe sepsis without septic shock: Secondary | ICD-10-CM | POA: Diagnosis not present

## 2021-02-07 LAB — URINE CULTURE: Culture: NO GROWTH

## 2021-02-07 NOTE — Evaluation (Signed)
Clinical/Bedside Swallow Evaluation Patient Details  Name: [name] MRN: [id number] Date of Birth: 1961-04-24  Today's Date: 02/07/2021 Time: SLP Start Time (ACUTE ONLY): 3220 SLP Stop Time (ACUTE ONLY): 1208 SLP Time Calculation (min) (ACUTE ONLY): 23 min  Past Medical History:  Past Medical History:  Diagnosis Date  . Blind   . GERD (gastroesophageal reflux disease)   . Hyperlipidemia   . Hypertension   . Hypoxic brain injury ([place])   . Muscle spasms of head and/or neck    and overall body  . Obesity    Past Surgical History:  Past Surgical History:  Procedure Laterality Date  . CHOLECYSTECTOMY    . CYSTOSCOPY W/ URETERAL STENT PLACEMENT Right 12/14/2020   Procedure: CYSTOSCOPY WITH RETROGRADE PYELOGRAM/URETERAL STENT PLACEMENT;  Surgeon: Robley Fries, MD;  Location: [facility];  Service: Urology;  Laterality: Right;  . ESOPHAGOGASTRODUODENOSCOPY N/A 01/04/2013   URK:YHCWC hiatal hernia/GASTRIC PolypS/MODERATE Non-erosive gastritis/SMALL nodule in the duodenal bulb  . high pressure shunt    . MALONEY DILATION N/A 01/04/2013   Procedure: MALONEY DILATION;  Surgeon: Danie Binder, MD;  Location: [facility];  Service: Endoscopy;  Laterality: N/A;  . MASS EXCISION Left 09/12/2013   Procedure: EXCISION NEOPLASM LEFT SHOULDER;  Surgeon: Scherry Ran, MD;  Location: [facility];  Service: General;  Laterality: Left;  . SAVORY DILATION N/A 01/04/2013   Procedure: SAVORY DILATION;  Surgeon: Danie Binder, MD;  Location: [facility];  Service: Endoscopy;  Laterality: N/A;   HPI:  60 y.o. male with medical history significant for but not limited too blindness, history of GERD, hyperlipidemia, hypertension, history of hypoxic brain injury at the age of 45, history of muscle spasms, obesity as well as other comorbidities including nephrolithiasis admitted on 02/04/2021 after aspiration episode while undergoing induction of anesthesia for urological procedure leading to acute hypoxic  respiratory failure requiring intubation. BSE requested, Pt is currently tolerating a mech soft and thin liquid diet.   Assessment / Plan / Recommendation Clinical Impression  Clinical swallowing evaluation completed while Pt was sitting upright in bed and Pt's mother was present at bedside. Note Pt presents with a slightly hoarse vocal quality. Pt consumed thin liquids initially, impulsively taking consecutive large sips and demonstrated an immediate overt cough, however, after being cued to take small sips, Pt demonstrated no further overt s/sx of aspiration with any thin trials or regular textures. Pt's mother reports he drinks impulsively at baseline, however, since this acute episode when he does drink fast and large consecutive sips now he "gets choked". Recommend continue with D3/mech soft diet and thin liquids with precautions re: ONE sip at a time, ensure Pt is sitting upright and utilize a slow consumption rate. ST will f/u X1 to ensure diet tolerance. Thank you,      Aspiration Risk  Mild aspiration risk    Diet Recommendation Dysphagia 3 (Mech soft);Thin liquid   Liquid Administration via: Straw;Cup Medication Administration: Whole meds with liquid Supervision: Staff to assist with self feeding;Full supervision/cueing for compensatory strategies;Comment (Mother remains present at bedside and feeds Pt) Compensations: Minimize environmental distractions;Slow rate;Small sips/bites Postural Changes: Seated upright at 90 degrees    Other  Recommendations Oral Care Recommendations: Oral care BID   Follow up Recommendations  F/u X1 to ensure diet tolerance     Frequency and Duration min 1 x/week  1 week       Prognosis Prognosis for Safe Diet Advancement: Good      Swallow Study  General Date of Onset: 02/04/21 HPI: 60 y.o. male with medical history significant for but not limited too blindness, history of GERD, hyperlipidemia, hypertension, history of hypoxic brain injury at  the age of 22, history of muscle spasms, obesity as well as other comorbidities including nephrolithiasis admitted on 02/04/2021 after aspiration episode while undergoing induction of anesthesia for urological procedure leading to acute hypoxic respiratory failure requiring intubation. BSE requested, Pt is currently tolerating a mech soft and thin liquid diet. Type of Study: Bedside Swallow Evaluation Previous Swallow Assessment: none in chart Diet Prior to this Study: Dysphagia 3 (soft);Thin liquids Temperature Spikes Noted: No Respiratory Status: Room air History of Recent Intubation: Yes Length of Intubations (days): 1 days Date extubated: 02/05/21 Behavior/Cognition: Alert;Cooperative;Pleasant mood Oral Cavity Assessment: Within Functional Limits Oral Care Completed by SLP: Recent completion by staff Oral Cavity - Dentition: Adequate natural dentition Vision: Functional for self-feeding Self-Feeding Abilities: Needs assist;Needs set up Patient Positioning: Upright in bed Baseline Vocal Quality: Normal;Hoarse Volitional Cough: Strong Volitional Swallow: Able to elicit    Oral/Motor/Sensory Function Overall Oral Motor/Sensory Function: Within functional limits   Ice Chips Ice chips: Within functional limits   Thin Liquid Thin Liquid: Within functional limits    Nectar Thick Nectar Thick Liquid: Not tested   Honey Thick Honey Thick Liquid: Not tested   Puree Puree: Within functional limits   Solid     Solid: Within functional limits     Danielle Mink H. Roddie Mc, CCC-SLP Speech Language Pathologist\  Wende Bushy 02/07/2021,12:08 PM

## 2021-02-07 NOTE — Plan of Care (Signed)
  Problem: Health Behavior/Discharge Planning: Goal: Ability to manage health-related needs will improve Outcome: Progressing   Problem: Clinical Measurements: Goal: Ability to maintain clinical measurements within normal limits will improve Outcome: Progressing Goal: Will remain free from infection Outcome: Progressing Goal: Diagnostic test results will improve Outcome: Progressing   Problem: Activity: Goal: Risk for activity intolerance will decrease Outcome: Progressing   Problem: Nutrition: Goal: Adequate nutrition will be maintained Outcome: Progressing   Problem: Safety: Goal: Ability to remain free from injury will improve Outcome: Progressing   Problem: Skin Integrity: Goal: Risk for impaired skin integrity will decrease Outcome: Progressing

## 2021-02-07 NOTE — Progress Notes (Signed)
Patient Demographics:    [name], is a 60 y.o. male, DOB - [birth year], JHH:[id number]  Admit date - 02/04/2021   Admitting Physician Kerney Elbe, DO  Outpatient Primary MD for the patient is [facility]  LOS - 3   No chief complaint on file.       Subjective:    [name] today has no fevers-- -did well with speech eval -Tolerating oral intake -Mom at bedside, questions answered -  Assessment  & Plan :    Principal Problem:   Severe sepsis with hypoxic respiratory failure due to aspiration pneumonia Active Problems:   Intraoperative acute respiratory failure   Blindness   History of anoxic brain injury at age 70   Right ureteral calculus   Essential hypertension   Aspiration into airway    Brief Summary:- 60 y.o. male with medical history significant for but not limited too blindness, history of GERD, hyperlipidemia, hypertension, history of hypoxic brain injury at the age of 58, history of muscle spasms, obesity as well as other comorbidities including nephrolithiasis admitted on 02/04/2021 after aspiration episode while undergoing induction of anesthesia for urological procedure leading to acute hypoxic respiratory failure requiring intubation  A/p 1)Acute hypoxic respiratory failure requiring intubation due to aspiration pneumonia -Intubated 02/04/2021 --Patient did well with sedation vacation and CPAP trial -Subsequently extubated at 1 PM on 02/05/21 -PCCM consult from Dr. Melvyn Novas appreciated -Weaned off oxygen as of 02/06/2021 -Continue mucolytics and bronchodilators -Hypoxia has resolved -Repeat chest x-ray on 02/06/2021 showed improved aeration and improving pneumonia  2) severe sepsis secondary to aspiration pneumonia--patient met sepsis criteria on admission with tachycardia, tachypnea and leukocytosis and aspiration pneumonia -Currently on Rocephin  and Flagyl -Urine cultures pending  sepsis pathophysiology has resolved -Please see #1 above  3)GERD--continue Protonix  4)Ureteral Obstructive Nephrolithiasis-- -Patient underwent a ureteral stent placement on 12/04/2020 for a 5 mm distal ureteral calculus with UTI.  He started having right flank pain with a stent in place and urology discussed the management of kidney stones and the plan is for a right ureteroscopic stone extraction.  cystoscopy with retrograde pyelogram and ureteral stent exchange as well as -patient was scheduled for Foley and laser application on the right for his right ureteral calculus and nephrolithiasis on 02/04/2021--- procedure was canceled due to aspiration pneumonia, urologist will reevaluate on Monday, 02/08/2021 to see if procedure can be done -Continue IV Rocephin for now, may discontinue postprocedure if urine cultures are still NGTD  5)HTN/HLD--- amlodipine for BP control, atorvastatin for HLD  6)Weakness and deconditioning--- awaiting PT eval  7)History of anoxic brain injury at age 14--- patient has baseline cognitive/neuropsychiatric deficits and vision loss -Patient stays at a group home, his mother checks on him  22) dysphagia concerns--- aspiration pneumonia as above 1 but this was during induction of anesthesia -Speech pathology eval appreciated -Dysphagia 3 (Mech soft);Thin liquid   Disposition/Need for in-Hospital Stay- patient unable to be discharged at this time due to -aspiration pneumonia requiring IV antibiotics, nephrolithiasis requiring urological intervention  Status is: Inpatient  Remains inpatient appropriate because:Please see disposition above   Disposition: The patient is from: Group home              Anticipated  d/c is to: Group home              Anticipated d/c date is: 1 day              Patient currently is not medically stable to d/c. Barriers: Not Clinically Stable-   Code Status : -  Code Status: Full Code   Family  Communication:   Discussed with patient's mother at bedside  Procedures:-  Intubated 02/04/21 Extubated 02/05/21 Foley and laser application on the right for his right ureteral calculus and nephrolithiasis planned for  02/08/2021  Consults  :  PCCM/urology  DVT Prophylaxis  :   - SCDs----we need to hold heparin p.m. of 02/07/2021 to allow for identical procedure on 02/08/2021 -  heparin injection 5,000 Units Start: 02/04/21 1800 SCDs Start: 02/04/21 1457   Lab Results  Component Value Date   PLT 268 02/06/2021    Inpatient Medications  Scheduled Meds: . amLODipine  5 mg Oral Daily  . atorvastatin  40 mg Per Tube QHS  . chlorhexidine  15 mL Mouth/Throat BID  . Chlorhexidine Gluconate Cloth  6 each Topical Daily  . escitalopram  10 mg Per Tube QHS  . guaiFENesin  600 mg Oral BID  . heparin  5,000 Units Subcutaneous Q8H  . ipratropium-albuterol  3 mL Nebulization TID  . mouth rinse  15 mL Mouth Rinse BID  . pantoprazole (PROTONIX) IV  40 mg Intravenous Q24H  . polyethylene glycol  17 g Per Tube Daily  . senna-docusate  2 tablet Oral QHS   Continuous Infusions: . sodium chloride 30 mL/hr at 02/06/21 1558  . cefTRIAXone (ROCEPHIN)  IV 1 g (02/07/21 1159)  . metronidazole 500 mg (02/07/21 0736)   PRN Meds:.acetaminophen **OR** acetaminophen, albuterol, bisacodyl, hydrALAZINE, ondansetron **OR** ondansetron (ZOFRAN) IV    Anti-infectives (From admission, onward)   Start     Dose/Rate Route Frequency Ordered Stop   02/05/21 1200  cefTRIAXone (ROCEPHIN) 1 g in sodium chloride 0.9 % 100 mL IVPB        1 g 200 mL/hr over 30 Minutes Intravenous Every 24 hours 02/04/21 1536     02/04/21 1630  metroNIDAZOLE (FLAGYL) IVPB 500 mg        500 mg 100 mL/hr over 60 Minutes Intravenous Every 8 hours 02/04/21 1536     02/04/21 1245  cefTRIAXone (ROCEPHIN) 2 g in sodium chloride 0.9 % 100 mL IVPB        2 g 200 mL/hr over 30 Minutes Intravenous 30 min pre-op 02/04/21 1233 02/04/21 1354    02/04/21 1230  sodium chloride 0.9 % with cefTRIAXone (ROCEPHIN) ADS Med       Note to Pharmacy: Hinton Rao   : cabinet override      02/04/21 1230 02/04/21 1340        Objective:   Vitals:   02/07/21 0818 02/07/21 0853 02/07/21 0900 02/07/21 1126  BP:   (!) 150/75   Pulse: 66  (!) 58 (!) 57  Resp: (!) _0 Temp: 98.4 F (36.9 C)   98.7 F (37.1 C)  TempSrc: Oral   Oral  SpO2: 96% 96% 98% 91%  Weight:      Height:        Wt Readings from Last 3 Encounters:  02/07/21 106.1 kg  02/02/21 103.9 kg  01/05/21 99.3 kg     Intake/Output Summary (Last 24 hours) at 02/07/2021 1412 Last data filed at 02/07/2021 1303 Gross per 24 hour  Intake 1421.79 ml  Output 1450 ml  Net -28.21 ml   Physical Exam  Gen:- Awake Alert,  In no apparent distress  HEENT:- [place].AT, No sclera icterus, vision loss (not new) Neck-Supple Neck,No JVD,.  Lungs-improved air movement, few scattered rhonchi, no wheezing CV- S1, S2 normal, regular  Abd-  +ve B.Sounds, Abd Soft, No tenderness,    Extremity/Skin:- No  edema, pedal pulses present  NeuroPsych--- baseline neuropsychiatric  deficits due to anoxic brain injury at age 89   Data Review:   Micro Results Recent Results (from the past 240 hour(s))  SARS Coronavirus 2 by RT PCR (hospital order, performed in [facility] hospital lab) Nasopharyngeal Nasopharyngeal Swab     Status: None   Collection Time: 02/04/21 10:14 AM   Specimen: Nasopharyngeal Swab  Result Value Ref Range Status   SARS Coronavirus 2 NEGATIVE NEGATIVE Final    Comment: (NOTE) SARS-CoV-2 target nucleic acids are NOT DETECTED.  The SARS-CoV-2 RNA is generally detectable in upper and lower respiratory specimens during the acute phase of infection. The lowest concentration of SARS-CoV-2 viral copies this assay can detect is 250 copies / mL. A negative result does not preclude SARS-CoV-2 infection and should not be used as the sole basis for treatment or other patient  management decisions.  A negative result may occur with improper specimen collection / handling, submission of specimen other than nasopharyngeal swab, presence of viral mutation(s) within the areas targeted by this assay, and inadequate number of viral copies (<250 copies / mL). A negative result must be combined with clinical observations, patient history, and epidemiological information.  Fact Sheet for Patients:   StrictlyIdeas.no  Fact Sheet for Healthcare Providers: BankingDealers.co.za  This test is not yet approved or  cleared by the [place] FDA and has been authorized for detection and/or diagnosis of SARS-CoV-2 by FDA under an Emergency Use Authorization (EUA).  This EUA will remain in effect (meaning this test can be used) for the duration of the COVID-19 declaration under Section 564(b)(1) of the Act, 21 U.S.C. section 360bbb-3(b)(1), unless the authorization is terminated or revoked sooner.  Performed at [facility], [address], [place], [place] [address]   MRSA PCR Screening     Status: None   Collection Time: 02/04/21  3:16 PM   Specimen: Nasal Mucosa; Nasopharyngeal  Result Value Ref Range Status   MRSA by PCR NEGATIVE NEGATIVE Final    Comment:        The GeneXpert MRSA Assay (FDA approved for NASAL specimens only), is one component of a comprehensive MRSA colonization surveillance program. It is not intended to diagnose MRSA infection nor to guide or monitor treatment for MRSA infections. Performed at [facility] [address], [place], [place] [address]   Urine culture     Status: None   Collection Time: 02/05/21  9:30 AM   Specimen: Urine, Clean Catch  Result Value Ref Range Status   Specimen Description   Final    URINE, CLEAN CATCH Performed at [facility], [address], [place], [place] [address]    Special Requests   Final    NONE Performed at [facility], [address]  [place], [place] [address]    Culture   Final    NO GROWTH Performed at [facility], [place] [address] [place], [place] [address]    Report Status 02/07/2021 FINAL  Final  Culture, Respiratory w Gram Stain     Status: None (Preliminary result)   Collection  Time: 02/05/21  8:15 PM   Specimen: SPU  Result Value Ref Range Status   Specimen Description   Final    SPUTUM Performed at [facility], [address] [place], [place] [address]    Special Requests   Final    NONE Performed at [facility], [address], [place], [place] [address]    Gram Stain   Final    ABUNDANT WBC PRESENT, PREDOMINANTLY PMN RARE SQUAMOUS EPITHELIAL CELLS PRESENT RARE GRAM POSITIVE COCCI IN PAIRS RARE GRAM POSITIVE RODS RARE GRAM NEGATIVE RODS    Culture   Final    CULTURE REINCUBATED FOR BETTER GROWTH Performed at [facility], [place] [address], [place], [place] [address]    Report Status PENDING  Incomplete    Radiology Reports DG Chest 2 View  Result Date: 02/06/2021 CLINICAL DATA:  Dyspnea.  History of hypertension. EXAM: CHEST - 2 VIEW COMPARISON:  02/05/2021 and older exams. FINDINGS: Since the prior study, the endotracheal tube and nasal/orogastric tube have been removed. Interstitial and subtle airspace opacities noted in the right lung on the prior study have improved. No new lung abnormalities. No convincing pleural effusion and no pneumothorax. 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 IMPRESSION: 1. Improved lung aeration with a decrease in interstitial airspace opacities consistent with either improved pneumonia or or edema. 2. No new abnormalities. 3. Status post extubation. Electronically Signed   By: Lajean Manes M.D.   On: 02/06/2021 10:00   DG CHEST PORT 1 VIEW  Result Date: 02/05/2021 CLINICAL DATA:  Shortness of breath EXAM: PORTABLE CHEST 1 VIEW COMPARISON:  02/04/2021 FINDINGS: Endotracheal tube terminates 3.6 cm above the carina. Enteric tube remains appropriately position  within the gastric body. Stable heart size. Atherosclerotic calcification of the aortic knob. Increasing airspace opacities throughout the right lung. No pleural effusion or pneumothorax. IMPRESSION: 1. Increasing airspace opacities throughout the right lung may reflect asymmetric edema versus pneumonia. 2. Stable lines and tubes, as above. Electronically Signed   By: Davina Poke D.O.   On: 02/05/2021 08:36   DG CHEST PORT 1 VIEW  Result Date: 02/04/2021 CLINICAL DATA:  Hypoxia EXAM: PORTABLE CHEST 1 VIEW COMPARISON:  December 13, 2020 FINDINGS: Endotracheal tube tip is 5.1 cm above the carina. Nasogastric tube tip and side port in stomach. No pneumothorax. There is mild atelectatic change in each mid lung. Lungs elsewhere clear. Heart size and pulmonary vascularity are normal. No adenopathy. No bone lesions. Note that ventricular shunt catheter extends along the right hemithorax, stable. IMPRESSION: Tube positions as described without pneumothorax. Midlung atelectasis bilaterally. Lungs otherwise clear. Cardiac silhouette within normal limits. Electronically Signed   By: Lowella Grip III M.D.   On: 02/04/2021 14:53     CBC Recent Labs  Lab 02/04/21 1536 02/05/21 0456 02/06/21 0431  WBC 12.8* 22.4* 12.6*  HGB 14.8 13.9 11.0*  HCT 46.5 44.1 34.6*  PLT 294 345 268  MCV 86.6 87.7 87.4  MCH 27.6 27.6 27.8  MCHC 31.8 31.5 31.8  RDW 15.2 15.4 15.6*  LYMPHSABS 1.6 1.3  --   MONOABS 0.2 0.6  --   EOSABS 0.1 0.3  --   BASOSABS 0.1 0.1  --     Chemistries  Recent Labs  Lab 02/04/21 1536 02/05/21 0456 02/06/21 0431  NA 138 139 140  K 4.0 3.7 3.4*  CL 100 103 105  CO2 _0 GLUCOSE 163* 154* 119*  BUN 16 20 21*  CREATININE 0.68 0.83 0.75  CALCIUM 9.2 9.5 8.7*  MG 2.0 2.0  --   AST 48* 32  --   ALT 53* 40  --   ALKPHOS 108 92  --   BILITOT 0.7 0.5  --     ------------------------------------------------------------------------------------------------------------------ Recent Labs    02/05/21 0456  TRIG 252*    No results found for: HGBA1C ------------------------------------------------------------------------------------------------------------------ No results for input(s): TSH, T4TOTAL, T3FREE, THYROIDAB in the last 72 hours.  Invalid input(s): FREET3 ------------------------------------------------------------------------------------------------------------------ No results for input(s): VITAMINB12, FOLATE, FERRITIN, TIBC, IRON, RETICCTPCT in the last 72 hours.  Coagulation profile No results for input(s): INR, PROTIME in the last 168 hours.  No results for input(s): DDIMER in the last 72 hours.  Cardiac Enzymes No results for input(s): CKMB, TROPONINI, MYOGLOBIN in the last 168 hours.  Invalid input(s): CK ------------------------------------------------------------------------------------------------------------------ No results found for: BNP   Roxan Hockey M.D on 02/07/2021 at 2:12 PM  Go to [email or web address] - for contact info  [facility] - Office  [phone]

## 2021-02-08 ENCOUNTER — Inpatient Hospital Stay (HOSPITAL_COMMUNITY): Payer: Medicare Other

## 2021-02-08 DIAGNOSIS — R6520 Severe sepsis without septic shock: Secondary | ICD-10-CM | POA: Diagnosis not present

## 2021-02-08 DIAGNOSIS — A419 Sepsis, unspecified organism: Secondary | ICD-10-CM | POA: Diagnosis not present

## 2021-02-08 LAB — GLUCOSE, CAPILLARY
Glucose-Capillary: 138 mg/dL — ABNORMAL HIGH (ref 70–99)
Glucose-Capillary: 159 mg/dL — ABNORMAL HIGH (ref 70–99)
Glucose-Capillary: 125 mg/dL — ABNORMAL HIGH (ref 70–99)
Glucose-Capillary: 132 mg/dL — ABNORMAL HIGH (ref 70–99)
Glucose-Capillary: 136 mg/dL — ABNORMAL HIGH (ref 70–99)
Glucose-Capillary: 138 mg/dL — ABNORMAL HIGH (ref 70–99)
Glucose-Capillary: 152 mg/dL — ABNORMAL HIGH (ref 70–99)
Glucose-Capillary: 166 mg/dL — ABNORMAL HIGH (ref 70–99)
Glucose-Capillary: 176 mg/dL — ABNORMAL HIGH (ref 70–99)

## 2021-02-08 LAB — CBC
HCT: 38 % — ABNORMAL LOW (ref 39.0–52.0)
Hemoglobin: 12.3 g/dL — ABNORMAL LOW (ref 13.0–17.0)
MCH: 27.5 pg (ref 26.0–34.0)
MCHC: 32.4 g/dL (ref 30.0–36.0)
MCV: 84.8 fL (ref 80.0–100.0)
Platelets: 264 10*3/uL (ref 150–400)
RBC: 4.48 MIL/uL (ref 4.22–5.81)
RDW: 15.1 % (ref 11.5–15.5)
WBC: 10.4 10*3/uL (ref 4.0–10.5)
nRBC: 0 % (ref 0.0–0.2)

## 2021-02-08 LAB — BASIC METABOLIC PANEL
Anion gap: 8 (ref 5–15)
BUN: 14 mg/dL (ref 6–20)
CO2: 28 mmol/L (ref 22–32)
Calcium: 9 mg/dL (ref 8.9–10.3)
Chloride: 104 mmol/L (ref 98–111)
Creatinine, Ser: 0.7 mg/dL (ref 0.61–1.24)
GFR, Estimated: >60 mL/min (ref >60)
Glucose, Bld: 141 mg/dL — ABNORMAL HIGH (ref 70–99)
Potassium: 3.4 mmol/L — ABNORMAL LOW (ref 3.5–5.1)
Sodium: 140 mmol/L (ref 135–145)

## 2021-02-08 LAB — CULTURE, RESPIRATORY W GRAM STAIN: Culture: NORMAL

## 2021-02-08 MED ORDER — GUAIFENESIN ER 600 MG PO TB12: 600.0000 mg | ORAL_TABLET | Freq: Two times a day (BID) | ORAL | 0 refills | Status: AC

## 2021-02-08 MED ORDER — CEFDINIR 300 MG PO CAPS: 300.0000 mg | ORAL_CAPSULE | Freq: Two times a day (BID) | ORAL | 0 refills | Status: AC

## 2021-02-08 MED ORDER — ACETAMINOPHEN 325 MG PO TABS: 650.0000 mg | ORAL_TABLET | Freq: Four times a day (QID) | ORAL | 0 refills | Status: AC | PRN

## 2021-02-08 MED ORDER — POLYETHYLENE GLYCOL 3350 17 G PO PACK: 17.0000 g | PACK | Freq: Every day | ORAL | 0 refills | Status: AC

## 2021-02-08 MED ORDER — IPRATROPIUM-ALBUTEROL 0.5-2.5 (3) MG/3ML IN SOLN
3.0000 mL | Freq: Two times a day (BID) | RESPIRATORY_TRACT | Status: DC
  Administered 2021-02-08: 3 mL via RESPIRATORY_TRACT
  Filled 2021-02-08 (×2): qty 3

## 2021-02-08 NOTE — Progress Notes (Signed)
Nsg Discharge Note  Admit Date:  02/04/2021 Discharge date: 02/08/2021   GAREK SCHUNEMAN to be D/C'd [facility] per MD order.  AVS completed.  Copy for chart, and copy for patient signed, and dated. Patient/caregiver able to verbalize understanding.  Discharge Medication: Allergies as of 02/08/2021      Reactions   Lisinopril    Other reaction(s): "made me loopy"   Penicillins Hives      Medication List    STOP taking these medications   naproxen 500 MG tablet Commonly known as: NAPROSYN     TAKE these medications   acetaminophen 325 MG tablet Commonly known as: TYLENOL Take 2 tablets (650 mg total) by mouth every 6 (six) hours as needed for mild pain, fever or headache (or Fever >/= 101). What changed:   medication strength  how much to take  when to take this  reasons to take this   amLODipine 10 MG tablet Commonly known as: NORVASC Take 10 mg by mouth daily with breakfast.   aspirin EC 81 MG tablet Take 81 mg by mouth daily with breakfast. Swallow whole.   atorvastatin 40 MG tablet Commonly known as: LIPITOR Take 40 mg by mouth at bedtime.   cefdinir 300 MG capsule Commonly known as: OMNICEF Take 1 capsule (300 mg total) by mouth 2 (two) times daily for 3 days. Start taking on: Feb 09, 2021   cholecalciferol 25 MCG (1000 UNIT) tablet Commonly known as: VITAMIN D3 Take 1,000 Units by mouth daily with breakfast.   cyclobenzaprine 5 MG tablet Commonly known as: FLEXERIL Take 5 mg by mouth 3 (three) times daily.   escitalopram 10 MG tablet Commonly known as: LEXAPRO Take 10 mg by mouth at bedtime.   esomeprazole 40 MG capsule Commonly known as: NEXIUM Take 40 mg by mouth daily with breakfast.   guaiFENesin 600 MG 12 hr tablet Commonly known as: MUCINEX Take 1 tablet (600 mg total) by mouth 2 (two) times daily.   HYDROcodone-acetaminophen 5-325 MG tablet Commonly known as: Norco Take 1 tablet by mouth every 6 (six) hours as needed for moderate  pain.   magnesium oxide 400 MG tablet Commonly known as: MAG-OX Take 400 mg by mouth daily with breakfast.   meclizine 25 MG tablet Commonly known as: ANTIVERT Take 25 mg by mouth every 6 (six) hours as needed for dizziness.   multivitamin with minerals Tabs tablet Take 1 tablet by mouth daily with breakfast.   polyethylene glycol 17 g packet Commonly known as: MIRALAX / GLYCOLAX Place 17 g into feeding tube daily. Start taking on: Feb 09, 2021   potassium chloride 10 MEQ tablet Commonly known as: KLOR-CON Take 10 mEq by mouth daily with breakfast.   propranolol 40 MG tablet Commonly known as: INDERAL Take 40 mg by mouth 2 (two) times daily.   riboflavin 100 MG Tabs tablet Commonly known as: VITAMIN B-2 Take 400 mg by mouth daily with breakfast.   SUMAtriptan 100 MG tablet Commonly known as: IMITREX Take 100 mg by mouth See admin instructions. Take 100mg  at onset of migraine. If no relief, may repeat in 2 hours. Max 2 doses in 24 hours       Discharge Assessment: Vitals:   02/08/21 1000 02/08/21 1100  BP: (!) 158/97   Pulse: 63 76  Resp: 17 19  Temp:    SpO2: 94% 93%   Skin clean, dry and intact without evidence of skin break down, no evidence of skin tears noted. IV catheter  discontinued intact. Site without signs and symptoms of complications - no redness or edema noted at insertion site, patient denies c/o pain - only slight tenderness at site.  Dressing with slight pressure applied.  D/c Instructions-Education: Discharge instructions given to patient/family with verbalized understanding. D/c education completed with patient/family including follow up instructions, medication list, d/c activities limitations if indicated, with other d/c instructions as indicated by MD - patient able to verbalize understanding, all questions fully answered. Patient instructed to return to ED, call 911, or call MD for any changes in condition.  Patient escorted via [place], and D/C to  group home via private auto.  Carney Corners, RN 02/08/2021 12:06 PM

## 2021-02-08 NOTE — NC FL2 (Signed)
[place] LEVEL OF CARE SCREENING TOOL     IDENTIFICATION  Patient Name: [name] Birthdate: [birth year] Sex: male Admission Date (Current Location): 02/04/2021  [place] and [place] Number:  [facility] [id number] [place] and Address:  [place] [address] [place]      Provider Number: [id number]  Attending Physician Name and Address:  Roxan Hockey, MD  Relative Name and Phone Number:       Current Level of Care: Hospital Recommended Level of Care: Other (Comment) (Group home) Prior Approval Number:    Date Approved/Denied:   PASRR Number:    Discharge Plan: Other (Comment)    Current Diagnoses: Patient Active Problem List   Diagnosis Date Noted  . Intraoperative acute respiratory failure 02/05/2021  . Severe sepsis with hypoxic respiratory failure due to aspiration pneumonia 02/05/2021  . Blindness 02/05/2021  . History of anoxic brain injury at age 60 02/05/2021  . Aspiration into airway 02/04/2021  . 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 Right ureteral calculus 12/14/2020  . Pyelonephritis 12/14/2020  . Essential hypertension 12/14/2020  . Mass of shoulder region 09/12/2013  . Dysphagia 12/20/2012  . GERD (gastroesophageal reflux disease) 12/20/2012    Orientation RESPIRATION BLADDER Height & Weight     Self,Time,Situation,Place  Normal Continent Weight: 231 lb 7.7 oz (105 kg) Height:  5\' 6"  (167.6 cm)  BEHAVIORAL SYMPTOMS/MOOD NEUROLOGICAL BOWEL NUTRITION STATUS      Continent Diet (Soft diet)  AMBULATORY STATUS COMMUNICATION OF NEEDS Skin   Limited Assist Verbally Other (Comment) (Rash to abdomen and groin)                       Personal Care Assistance Level of Assistance  Bathing,Feeding,Dressing Bathing Assistance: Maximum assistance Feeding assistance: Limited assistance Dressing Assistance: Limited assistance     Functional Limitations Info  Sight,Speech,Hearing  Sight Info: Impaired (blind) Hearing Info: Adequate Speech Info: Adequate    SPECIAL CARE FACTORS FREQUENCY                       Contractures      Additional Factors Info  Code Status,Allergies,Psychotropic Code Status Info: Full code Allergies Info: Lisinopril, Penicillins Psychotropic Info: Lexapro         Current Medications (02/08/2021):  This is the current hospital active medication list Current Facility-Administered Medications  Medication Dose Route Frequency Provider Last Rate Last Admin  . 0.9 %  sodium chloride infusion   Intravenous Continuous Roxan Hockey, MD 30 mL/hr at 02/06/21 1558 New Bag at 02/06/21 1558  . acetaminophen (TYLENOL) tablet 650 mg  650 mg Oral Q6H PRN Raiford Noble [place], DO   650 mg at 02/07/21 9767   Or  . acetaminophen (TYLENOL) suppository 650 mg  650 mg Rectal Q6H PRN Raiford Noble Latif, DO      . albuterol (PROVENTIL) (2.5 MG/3ML) 0.083% nebulizer solution 2.5 mg  2.5 mg Nebulization Q2H PRN Sheikh, Omair Latif, DO      . amLODipine (NORVASC) tablet 5 mg  5 mg Oral Daily Emokpae, Courage, MD   5 mg at 02/08/21 0957  . atorvastatin (LIPITOR) tablet 40 mg  40 mg Per Tube QHS Raiford Noble [place], DO   40 mg at 02/07/21 2238  . bisacodyl (DULCOLAX) suppository 10 mg  10 mg Rectal Daily PRN Raiford Noble Latif, DO      . cefTRIAXone (ROCEPHIN) 1 g in sodium chloride 0.9 % 100 mL IVPB  1 g Intravenous Q24H Raiford Noble [place], DO 200 mL/hr at 02/07/21 1159 1 g at 02/07/21 1159  . chlorhexidine (PERIDEX) 0.12 % solution 15 mL  15 mL Mouth/Throat BID Raiford Noble [place], DO   15 mL at 02/08/21 0959  . Chlorhexidine Gluconate Cloth 2 % PADS 6 each  6 each Topical Daily Adefeso, Ol[place], DO   6 each at 02/08/21 0958  . escitalopram (LEXAPRO) tablet 10 mg  10 mg Per Tube QHS Raiford Noble [place], DO   10 mg at 02/07/21 2238  . guaiFENesin (MUCINEX) 12 hr tablet 600 mg  600 mg Oral BID [place] Brick, Courage, MD   600 mg at 02/08/21 0957  .  hydrALAZINE (APRESOLINE) injection 10 mg  10 mg Intravenous Q6H PRN Sheikh, Omair Latif, DO      . ipratropium-albuterol (DUONEB) 0.5-2.5 (3) MG/3ML nebulizer solution 3 mL  3 mL Nebulization BID [place] Brick, Courage, MD   3 mL at 02/08/21 0841  . MEDLINE mouth rinse  15 mL Mouth Rinse BID [place] Brick, Courage, MD   15 mL at 02/08/21 0959  . metroNIDAZOLE (FLAGYL) IVPB 500 mg  500 mg Intravenous [address] [place], DO 100 mL/hr at 02/08/21 0958 500 mg at 02/08/21 0958  . ondansetron (ZOFRAN) tablet 4 mg  4 mg Oral Q6H PRN Raiford Noble Latif, DO       Or  . ondansetron [facility]) injection 4 mg  4 mg Intravenous Q6H PRN Sheikh, Omair Latif, DO      . pantoprazole (PROTONIX) injection 40 mg  40 mg Intravenous Q24H Sheikh, Georgina Quint [place], DO   40 mg at 02/07/21 1558  . polyethylene glycol (MIRALAX / GLYCOLAX) packet 17 g  17 g Per Tube Daily Raiford Noble [place], DO   17 g at 02/08/21 0957  . senna-docusate (Senokot-S) tablet 2 tablet  2 tablet Oral QHS Roxan Hockey, MD   2 tablet at 02/07/21 2237     Discharge Medications: Medication List    STOP taking these medications   naproxen 500 MG tablet Commonly known as: NAPROSYN     TAKE these medications   acetaminophen 325 MG tablet Commonly known as: TYLENOL Take 2 tablets (650 mg total) by mouth every 6 (six) hours as needed for mild pain, fever or headache (or Fever >/= 101). What changed:   medication strength  how much to take  when to take this  reasons to take this   amLODipine 10 MG tablet Commonly known as: NORVASC Take 10 mg by mouth daily with breakfast.   aspirin EC 81 MG tablet Take 81 mg by mouth daily with breakfast. Swallow whole.   atorvastatin 40 MG tablet Commonly known as: LIPITOR Take 40 mg by mouth at bedtime.   cefdinir 300 MG capsule Commonly known as: OMNICEF Take 1 capsule (300 mg total) by mouth 2 (two) times daily for 3 days. Start taking on: Feb 09, 2021   cholecalciferol 25 MCG (1000  UNIT) tablet Commonly known as: VITAMIN D3 Take 1,000 Units by mouth daily with breakfast.   cyclobenzaprine 5 MG tablet Commonly known as: FLEXERIL Take 5 mg by mouth 3 (three) times daily.   escitalopram 10 MG tablet Commonly known as: LEXAPRO Take 10 mg by mouth at bedtime.   esomeprazole 40 MG capsule Commonly known as: NEXIUM Take 40 mg by mouth daily with breakfast.   guaiFENesin 600 MG 12 hr tablet Commonly known as: MUCINEX Take 1 tablet (600 mg total) by mouth 2 (two) times daily.  HYDROcodone-acetaminophen 5-325 MG tablet Commonly known as: Norco Take 1 tablet by mouth every 6 (six) hours as needed for moderate pain.   magnesium oxide 400 MG tablet Commonly known as: MAG-OX Take 400 mg by mouth daily with breakfast.   meclizine 25 MG tablet Commonly known as: ANTIVERT Take 25 mg by mouth every 6 (six) hours as needed for dizziness.   multivitamin with minerals Tabs tablet Take 1 tablet by mouth daily with breakfast.   polyethylene glycol 17 g packet Commonly known as: MIRALAX / GLYCOLAX Place 17 g into feeding tube daily. Start taking on: Feb 09, 2021   potassium chloride 10 MEQ tablet Commonly known as: KLOR-CON Take 10 mEq by mouth daily with breakfast.   propranolol 40 MG tablet Commonly known as: INDERAL Take 40 mg by mouth 2 (two) times daily.   riboflavin 100 MG Tabs tablet Commonly known as: VITAMIN B-2 Take 400 mg by mouth daily with breakfast.   SUMAtriptan 100 MG tablet Commonly known as: IMITREX Take 100 mg by mouth See admin instructions. Take 100mg  at onset of migraine. If no relief, may repeat in 2 hours. Max 2 doses in 24 hours      Relevant Imaging Results:  Relevant Lab Results:   Additional Information    Salome Arnt, LCSW

## 2021-02-08 NOTE — Progress Notes (Signed)
OT Cancellation Note  Patient Details Name: [name] MRN: [id number] DOB: Apr 30, 1961   Cancelled Treatment:    Reason Eval/Treat Not Completed: Patient at procedure or test/ unavailable;Patient not medically ready. Nursing reported pt would soon be leaving for surgery and was not recommended for evaluation at the time. Will attempt evaluation later as time allows.   [name] OT, MOT   Larey Seat 02/08/2021, 11:15 AM

## 2021-02-08 NOTE — Discharge Instructions (Signed)
1) please return on Wednesday, 02/10/2021 for outpatient urological procedure with Dr. Nicolette Bang- Please follow-up with Urologist Dr. Alyson Ingles in about --- for recheck and follow-up evaluation  in his office----[facility] [place], [address] [place] [address], [place] [address] Phone Number----[phone]  2) please repeat BMP and CBC blood test on Friday, 02/12/2021

## 2021-02-08 NOTE — Discharge Summary (Signed)
[name], is a 60 y.o. male  DOB 06-01-1961  MRN [id number].  Admission date:  02/04/2021  Admitting Physician  Kerney Elbe, DO  Discharge Date:  02/08/2021   Primary MD  [facility]  Recommendations for primary care physician for things to follow:   1)Please return on Wednesday, 02/10/2021 for outpatient urological procedure with Dr. Nicolette Bang- Please follow-up with Urologist Dr. Alyson Ingles in about --- for recheck and follow-up evaluation  in his office----[facility], [address], [place] [address] Phone Number----[phone]  2)Please repeat BMP and CBC blood test on Friday, 02/12/2021   Admission Diagnosis  Aspiration into airway [T17.908A]   Discharge Diagnosis  Aspiration into airway [T17.908A]    Principal Problem:   Severe sepsis with hypoxic respiratory failure due to aspiration pneumonia Active Problems:   Intraoperative acute respiratory failure   Blindness   History of anoxic brain injury at age 26   Right ureteral calculus   Essential hypertension   Aspiration into airway      Past Medical History:  Diagnosis Date  . Blind   . GERD (gastroesophageal reflux disease)   . Hyperlipidemia   . Hypertension   . Hypoxic brain injury ([place])   . Muscle spasms of head and/or neck    and overall body  . Obesity     Past Surgical History:  Procedure Laterality Date  . CHOLECYSTECTOMY    . CYSTOSCOPY W/ URETERAL STENT PLACEMENT Right 12/14/2020   Procedure: CYSTOSCOPY WITH RETROGRADE PYELOGRAM/URETERAL STENT PLACEMENT;  Surgeon: Robley Fries, MD;  Location: [facility];  Service: Urology;  Laterality: Right;  . ESOPHAGOGASTRODUODENOSCOPY N/A 01/04/2013   GGY:IRSWN hiatal hernia/GASTRIC PolypS/MODERATE Non-erosive gastritis/SMALL nodule in the duodenal bulb  . high pressure shunt    . MALONEY DILATION N/A 01/04/2013    Procedure: MALONEY DILATION;  Surgeon: Danie Binder, MD;  Location: [facility];  Service: Endoscopy;  Laterality: N/A;  . MASS EXCISION Left 09/12/2013   Procedure: EXCISION NEOPLASM LEFT SHOULDER;  Surgeon: Scherry Ran, MD;  Location: [facility];  Service: General;  Laterality: Left;  . SAVORY DILATION N/A 01/04/2013   Procedure: SAVORY DILATION;  Surgeon: Danie Binder, MD;  Location: [facility];  Service: Endoscopy;  Laterality: N/A;     HPI  from the history and physical done on the day of admission:     Patient coming from: PACU via [place]  Chief Complaint: Aspiration   HPI: [name] is a 60 y.o. male with medical history significant for but not limited too blindness, history of GERD, hyperlipidemia, hypertension, history of hypoxic brain injury at the age of 69, history of muscle spasms, obesity as well as other comorbidities including nephrolithiasis who presented today for cystoscopy with retrograde pyelogram and ureteral stent exchange as well as a Foley and laser application on the right for his right ureteral calculus and nephrolithiasis.  Patient underwent a ureteral stent placement on 12/04/2020 for a 5 mm  distal ureteral calculus with UTI.  He started having right flank pain with a stent in place and urology discussed the management of kidney stones and the plan is for a right ureteroscopic stone extraction.  He presented from his group home today for this procedure however they told the staff that he was in n.p.o. status but he had actually eaten this morning.  Patient was scheduled for cystoscopy under general anesthesia and LMA had been placed and patient was given fentany and l Versed for the procedure however then subsequently he was noted that he had gastric secretions in his LMA.  He underwent rapid sequence intubation given the large amount of gastric fluid noted with concern for vomiting.  LMA was removed and patient was suctioned and intubated with a 7-1/2 ET  tube and transferred to ICU.  Further management  ED Course: No ED course given that he came from from the PACU for an elective procedure as an outpatient  Review of Systems: As per HPI otherwise all other systems reviewed and negative.      Hospital Course:    Brief Summary:- 60 y.o.malewith medical history significantfor but not limited tooblindness, history of GERD, hyperlipidemia, hypertension, history of hypoxic brain injuryat the age of 77, history of muscle spasms, obesity as well as other comorbidities including nephrolithiasis admitted on 02/04/2021 after aspiration episode while undergoing induction of anesthesia for urological procedure leading to acute hypoxic respiratory failure requiring intubation  A/p 1)Acute hypoxic respiratory failure requiring intubation due to aspiration pneumonia -Intubated 02/04/2021 --Patient did well with sedation vacation and CPAP trial -Subsequently extubated at 1 PM on 02/05/21 -PCCM consult from Dr. Melvyn Novas appreciated -Weaned off oxygen as of 02/06/2021 -Continue mucolytics and bronchodilators -Hypoxia has resolved -Repeat chest x-ray on 02/06/2021 showed improved aeration and improving pneumonia -Repeat chest x-ray on 02/08/2021 shows resolved pneumonia   2) severe sepsis secondary to aspiration pneumonia--patient met sepsis criteria on admission with tachycardia, tachypnea and leukocytosis and aspiration pneumonia Treated with Rocephin and Flagyl --Discharged home on [place] for 3 days -Urine cultures NGTD  sepsis pathophysiology has resolved -Please see #1 above  3)GERD--continue Protonix  4)Ureteral Obstructive Nephrolithiasis-- -Patient underwent a ureteral stent placement on 12/04/2020 for a 5 mm distal ureteral calculus with UTI. He started having right flank pain with a stent in place and urology discussed the management of kidney stones and the plan is for a right ureteroscopic stone extraction. cystoscopy with retrograde  pyelogram and ureteral stent exchange as well as -patient was scheduled for Foley and laser application on the right for his right ureteral calculus and nephrolithiasis on 02/04/2021--- procedure was canceled due to aspiration pneumonia, As per Dr. Alyson Ingles patient will return on Wednesday,02/10/21 for urological procedure - 5)HTN/HLD--- amlodipine for BP control, atorvastatin for HLD  6)Weakness and deconditioning--- as per patient's mom he appears to be at baseline, usually uses wheelchair  7)History of anoxic brain injury at age 32--- patient has baseline cognitive/neuropsychiatric deficits and vision loss -Patient has a VP shunt -Patient stays at a group home, his mother checks on him  32) dysphagia concerns--- aspiration pneumonia as above 1 but this was during induction of anesthesia -Speech pathology eval appreciated -Dysphagia 3 (Mech soft);Thin liquid  Disposition-discharge back to group home  Disposition: The patient is from: Group home  Anticipated d/c is to: Group home   Code Status : -  Code Status: Full Code   Family Communication:   Discussed with patient's mother at bedside  Procedures:-  Intubated 02/04/21 Extubated  02/05/21 Foley and laser application on the right for his right ureteral calculus and nephrolithiasis planned for  02/10/2021 as outpatient  Consults  :  PCCM/urology  Discharge Condition: Stable  Follow UP   Follow-up Information    McKenzie, Candee Furbish, MD. Schedule an appointment as soon as possible for a visit in 2 day(s).   Specialty: Urology Why: come to [facility] for urological procedure with Dr Alcario Drought information: [address] [place] [address] [phone]               Diet and Activity recommendation:  As advised  Discharge Instructions    Discharge Instructions    Call MD for:  difficulty breathing, headache or visual disturbances   Complete by: As directed    Call MD  for:  persistant dizziness or light-headedness   Complete by: As directed    Call MD for:  persistant nausea and vomiting   Complete by: As directed    Call MD for:  severe uncontrolled pain   Complete by: As directed    Call MD for:  temperature >100.4   Complete by: As directed    Diet - low sodium heart healthy   Complete by: As directed    Discharge instructions   Complete by: As directed    1) please return on Wednesday, 02/10/2021 for outpatient urological procedure with Dr. Nicolette Bang- Please follow-up with Urologist Dr. Alyson Ingles in about --- for recheck and follow-up evaluation  in his office----[facility], [address], [place] [address] Phone Number----[phone]  2) please repeat BMP and CBC blood test on Friday, 02/12/2021   Increase activity slowly   Complete by: As directed         Discharge Medications     Allergies as of 02/08/2021      Reactions   Lisinopril    Other reaction(s): "made me loopy"   Penicillins Hives      Medication List    STOP taking these medications   naproxen 500 MG tablet Commonly known as: NAPROSYN     TAKE these medications   acetaminophen 325 MG tablet Commonly known as: TYLENOL Take 2 tablets (650 mg total) by mouth every 6 (six) hours as needed for mild pain, fever or headache (or Fever >/= 101). What changed:   medication strength  how much to take  when to take this  reasons to take this   amLODipine 10 MG tablet Commonly known as: NORVASC Take 10 mg by mouth daily with breakfast.   aspirin EC 81 MG tablet Take 81 mg by mouth daily with breakfast. Swallow whole.   atorvastatin 40 MG tablet Commonly known as: LIPITOR Take 40 mg by mouth at bedtime.   cefdinir 300 MG capsule Commonly known as: OMNICEF Take 1 capsule (300 mg total) by mouth 2 (two) times daily for 3 days. Start taking on: Feb 09, 2021   cholecalciferol 25 MCG (1000 UNIT) tablet Commonly known as: VITAMIN  D3 Take 1,000 Units by mouth daily with breakfast.   cyclobenzaprine 5 MG tablet Commonly known as: FLEXERIL Take 5 mg by mouth 3 (three) times daily.   escitalopram 10 MG tablet Commonly known as: LEXAPRO Take 10 mg by mouth at bedtime.   esomeprazole 40 MG capsule Commonly known as: NEXIUM Take 40 mg by mouth daily with breakfast.   guaiFENesin 600 MG 12 hr tablet Commonly known as: MUCINEX Take 1 tablet (600 mg total) by mouth  2 (two) times daily.   HYDROcodone-acetaminophen 5-325 MG tablet Commonly known as: Norco Take 1 tablet by mouth every 6 (six) hours as needed for moderate pain.   magnesium oxide 400 MG tablet Commonly known as: MAG-OX Take 400 mg by mouth daily with breakfast.   meclizine 25 MG tablet Commonly known as: ANTIVERT Take 25 mg by mouth every 6 (six) hours as needed for dizziness.   multivitamin with minerals Tabs tablet Take 1 tablet by mouth daily with breakfast.   polyethylene glycol 17 g packet Commonly known as: MIRALAX / GLYCOLAX Place 17 g into feeding tube daily. Start taking on: Feb 09, 2021   potassium chloride 10 MEQ tablet Commonly known as: KLOR-CON Take 10 mEq by mouth daily with breakfast.   propranolol 40 MG tablet Commonly known as: INDERAL Take 40 mg by mouth 2 (two) times daily.   riboflavin 100 MG Tabs tablet Commonly known as: VITAMIN B-2 Take 400 mg by mouth daily with breakfast.   SUMAtriptan 100 MG tablet Commonly known as: IMITREX Take 100 mg by mouth See admin instructions. Take 168m at onset of migraine. If no relief, may repeat in 2 hours. Max 2 doses in 24 hours       Major procedures and Radiology Reports - PLEASE review detailed and final reports for all details, in brief -    DG Chest 2 View  Result Date: 02/06/2021 CLINICAL DATA:  Dyspnea.  History of hypertension. EXAM: CHEST - 2 VIEW COMPARISON:  02/05/2021 and older exams. FINDINGS: Since the prior study, the endotracheal tube and  nasal/orogastric tube have been removed. Interstitial and subtle airspace opacities noted in the right lung on the prior study have improved. No new lung abnormalities. No convincing pleural effusion and no pneumothorax. 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 IMPRESSION: 1. Improved lung aeration with a decrease in interstitial airspace opacities consistent with either improved pneumonia or or edema. 2. No new abnormalities. 3. Status post extubation. Electronically Signed   By: DLajean ManesM.D.   On: 02/06/2021 10:00   DG Chest Port 1 View  Result Date: 02/08/2021 CLINICAL DATA:  Shortness of breath EXAM: PORTABLE CHEST 1 VIEW COMPARISON:  Feb 06, 2021 FINDINGS: Ventriculoperitoneal shunt on the right unchanged in positioning. No edema or airspace opacity. Heart size and pulmonary vascularity are normal. No adenopathy. There is degenerative change in the thoracic spine. IMPRESSION: Currently lungs clear. Cardiac silhouette normal. Ventriculoperitoneal shunt catheter again noted on the right. Electronically Signed   By: WLowella GripIII M.D.   On: 02/08/2021 09:13   DG CHEST PORT 1 VIEW  Result Date: 02/05/2021 CLINICAL DATA:  Shortness of breath EXAM: PORTABLE CHEST 1 VIEW COMPARISON:  02/04/2021 FINDINGS: Endotracheal tube terminates 3.6 cm above the carina. Enteric tube remains appropriately position within the gastric body. Stable heart size. Atherosclerotic calcification of the aortic knob. Increasing airspace opacities throughout the right lung. No pleural effusion or pneumothorax. IMPRESSION: 1. Increasing airspace opacities throughout the right lung may reflect asymmetric edema versus pneumonia. 2. Stable lines and tubes, as above. Electronically Signed   By: NDavina PokeD.O.   On: 02/05/2021 08:36   DG CHEST PORT 1 VIEW  Result Date: 02/04/2021 CLINICAL DATA:  Hypoxia EXAM: PORTABLE CHEST 1 VIEW COMPARISON:  December 13, 2020 FINDINGS: Endotracheal tube tip is 5.1 cm above the carina.  Nasogastric tube tip and side port in stomach. No pneumothorax. There is mild atelectatic change in each mid lung. Lungs elsewhere clear. Heart size  and pulmonary vascularity are normal. No adenopathy. No bone lesions. Note that ventricular shunt catheter extends along the right hemithorax, stable. IMPRESSION: Tube positions as described without pneumothorax. Midlung atelectasis bilaterally. Lungs otherwise clear. Cardiac silhouette within normal limits. Electronically Signed   By: Lowella Grip III M.D.   On: 02/04/2021 14:53    Micro Results   Recent Results (from the past 240 hour(s))  SARS Coronavirus 2 by RT PCR (hospital order, performed in [facility] hospital lab) Nasopharyngeal Nasopharyngeal Swab     Status: None   Collection Time: 02/04/21 10:14 AM   Specimen: Nasopharyngeal Swab  Result Value Ref Range Status   SARS Coronavirus 2 NEGATIVE NEGATIVE Final    Comment: (NOTE) SARS-CoV-2 target nucleic acids are NOT DETECTED.  The SARS-CoV-2 RNA is generally detectable in upper and lower respiratory specimens during the acute phase of infection. The lowest concentration of SARS-CoV-2 viral copies this assay can detect is 250 copies / mL. A negative result does not preclude SARS-CoV-2 infection and should not be used as the sole basis for treatment or other patient management decisions.  A negative result may occur with improper specimen collection / handling, submission of specimen other than nasopharyngeal swab, presence of viral mutation(s) within the areas targeted by this assay, and inadequate number of viral copies (<250 copies / mL). A negative result must be combined with clinical observations, patient history, and epidemiological information.  Fact Sheet for Patients:   StrictlyIdeas.no  Fact Sheet for Healthcare Providers: BankingDealers.co.za  This test is not yet approved or  cleared by the [place] FDA and has  been authorized for detection and/or diagnosis of SARS-CoV-2 by FDA under an Emergency Use Authorization (EUA).  This EUA will remain in effect (meaning this test can be used) for the duration of the COVID-19 declaration under Section 564(b)(1) of the Act, 21 U.S.C. section 360bbb-3(b)(1), unless the authorization is terminated or revoked sooner.  Performed at [facility], [address], [place], [place] [address]   MRSA PCR Screening     Status: None   Collection Time: 02/04/21  3:16 PM   Specimen: Nasal Mucosa; Nasopharyngeal  Result Value Ref Range Status   MRSA by PCR NEGATIVE NEGATIVE Final    Comment:        The GeneXpert MRSA Assay (FDA approved for NASAL specimens only), is one component of a comprehensive MRSA colonization surveillance program. It is not intended to diagnose MRSA infection nor to guide or monitor treatment for MRSA infections. Performed at [facility], [address], [place], [place] [address]   Urine culture     Status: None   Collection Time: 02/05/21  9:30 AM   Specimen: Urine, Clean Catch  Result Value Ref Range Status   Specimen Description   Final    URINE, CLEAN CATCH Performed at [facility], [address], [place], [place] [address]    Special Requests   Final    NONE Performed at [facility], [address] [place], [place] [address]    Culture   Final    NO GROWTH Performed at [facility], [place] [address] [place],  [address]    Report Status 02/07/2021 FINAL  Final  Culture, Respiratory w Gram Stain     Status: None   Collection Time: 02/05/21  8:15 PM   Specimen: SPU  Result Value Ref Range Status   Specimen Description   Final    SPUTUM Performed at [facility], [address],  [place], [place] [address]    Special Requests   Final    NONE Performed at [facility], [address] [place], [place] [address]    Gram Stain   Final    ABUNDANT WBC PRESENT, PREDOMINANTLY PMN RARE SQUAMOUS EPITHELIAL CELLS  PRESENT RARE GRAM POSITIVE COCCI IN PAIRS RARE GRAM POSITIVE RODS RARE GRAM NEGATIVE RODS    Culture   Final    FEW Normal respiratory flora-no Staph aureus or Pseudomonas seen Performed at [facility], [place] [address], [place], [place] [address]    Report Status 02/08/2021 FINAL  Final   Today   Subjective    Rayshon Albaugh today has no new complaints No fever  Or chills   No Nausea, Vomiting or Diarrhea Oral intake is good, mother at bedside, questions answered          Patient has been seen and examined prior to discharge   Objective   Blood pressure (!) 158/97, pulse 63, temperature (!) 97.4 F (36.3 C), temperature source Oral, resp. rate 17, height _0  (1.676 m), weight 105 kg, SpO2 94 %.   Intake/Output Summary (Last 24 hours) at 02/08/2021 1018 Last data filed at 02/07/2021 2300 Gross per 24 hour  Intake 1242.82 ml  Output --  Net 1242.82 ml    Exam Gen:- Awake Alert,  In no apparent distress  HEENT:- [place].AT, No sclera icterus, vision loss (not new) Neck-Supple Neck,No JVD,.  Lungs-lungs are mostly clear, air movement is fair and symmetrical CV- S1, S2 normal, regular  Abd-  +ve B.Sounds, Abd Soft, No tenderness,    Extremity/Skin:- No  edema, pedal pulses present  NeuroPsych--- baseline neuropsychiatric  deficits due to anoxic brain injury at age 59   Data Review   CBC w Diff:  Lab Results  Component Value Date   WBC 10.4 02/08/2021   HGB 12.3 (L) 02/08/2021   HCT 38.0 (L) 02/08/2021   PLT 264 02/08/2021   LYMPHOPCT 6 02/05/2021   MONOPCT 3 02/05/2021   EOSPCT 2 02/05/2021   BASOPCT 0 02/05/2021    CMP:  Lab Results  Component Value Date   NA 140 02/08/2021   K 3.4 (L) 02/08/2021   CL 104 02/08/2021   CO2 28 02/08/2021   BUN 14 02/08/2021   CREATININE 0.70 02/08/2021   PROT 7.6 02/05/2021   ALBUMIN 3.8 02/05/2021   BILITOT 0.5 02/05/2021   ALKPHOS 92 02/05/2021   AST 32 02/05/2021   ALT 40 02/05/2021  .   Total Discharge time  is about 33 minutes  Roxan Hockey M.D on 02/08/2021 at 10:18 AM  Go to [email or web address] -  for contact info  [facility] - Office  [phone]

## 2021-02-08 NOTE — TOC Transition Note (Addendum)
Transition of Care [facility]) - CM/SW Discharge Note   Patient Details  Name: [name] MRN: [id number] Date of Birth: [birth year]  Transition of Care [facility]) CM/SW Contact:  Salome Arnt, [place] Phone Number: 02/08/2021, 8:58 AM   Clinical Narrative:  Pt is resident at [facility]. LCSW spoke with pt's mother and Juliann Pulse at facility. They are aware of outpatient procedure scheduled for Wednesday. No home health prior to admission. Pt's mother will provide transport back to facility.  D/C summary and FL2 faxed to facility.     Final next level of care: Group Home Barriers to Discharge: Barriers Resolved   Patient Goals and CMS Choice Patient states their goals for this hospitalization and ongoing recovery are:: return to group home   Choice offered to / list presented to : Parent  Discharge Placement                  Name of family member notified: Hassan Rowan- mother Patient and family notified of of transfer: 02/08/21  Discharge Plan and Services                DME Arranged: N/A DME Agency: NA                  Social Determinants of Health ([place]) Interventions     Readmission Risk Interventions No flowsheet data found.

## 2021-02-09 ENCOUNTER — Other Ambulatory Visit: Payer: Self-pay

## 2021-02-09 ENCOUNTER — Encounter (HOSPITAL_COMMUNITY): Payer: Self-pay

## 2021-02-09 ENCOUNTER — Encounter (HOSPITAL_COMMUNITY)
Admission: RE | Admit: 2021-02-09 | Discharge: 2021-02-09 | Disposition: A | Discharge status: Home / Self Care | Payer: Medicare Other | Source: Ambulatory Visit | Attending: Urology | Admitting: Urology

## 2021-02-09 NOTE — Pre-Procedure Instructions (Signed)
Patient was discharged from hospital 02/08/2021 and is for surgery 02/10/2021. I called [facility] ans spoke with Courtenay, [phone], who will be bringing patient in the morning. She states patients Mom, Jeannine Boga ([phone]), will also be here with patient to sign consent and speak with anesthesia. I went over preop instructions with Dela via phone and also faxed copy to her. She verbalized understanding of instructions.

## 2021-02-09 NOTE — Patient Instructions (Signed)
    DEAKIN LACEK  02/09/2021     @PREFPERIOPPHARMACY @   Your procedure is scheduled on  02/10/2021   Report to [facility] at  70  A.M.   Call this number if you have problems the morning of surgery:  [phone]   Remember:  Do not eat or drink after midnight.                         Take these medicines the morning of surgery with A SIP OF WATER  Amlodipine, omnicef, flexeril (if needed), nexium, hydrocodone (if needed), antivert (if needed), propranolol, imitrex (if needed)     Please brush your teeth.  Do not wear jewelry, make-up or nail polish.  Do not wear lotions, powders, or perfumes, or deodorant.  Do not shave 48 hours prior to surgery.  Men may shave face and neck.  Do not bring valuables to the hospital.  [facility] is not responsible for any belongings or valuables.  Contacts, dentures or bridgework may not be worn into surgery.  Leave your suitcase in the car.  After surgery it may be brought to your room.  For patients admitted to the hospital, discharge time will be determined by your treatment team.  Patients discharged the day of surgery will not be allowed to drive home and must have someone with them for 24 hours.  Name and phone number of your driver:   Lisbeth Ply- [phone]   Please read over the following fact sheets that you were given. Care and Recovery After Surgery

## 2021-02-09 NOTE — Pre-Procedure Instructions (Signed)
Spoke with Dr Charna Elizabeth about patients surgery for 02-10-2021 and Dr Charna Elizabeth is okay in proceeding.

## 2021-02-10 ENCOUNTER — Encounter (HOSPITAL_COMMUNITY): Payer: Self-pay | Admitting: Urology

## 2021-02-10 ENCOUNTER — Ambulatory Visit (HOSPITAL_COMMUNITY): Payer: Medicare Other

## 2021-02-10 ENCOUNTER — Ambulatory Visit (HOSPITAL_COMMUNITY): Payer: Medicare Other | Admitting: Certified Registered Nurse Anesthetist

## 2021-02-10 ENCOUNTER — Other Ambulatory Visit (HOSPITAL_COMMUNITY)
Admission: RE | Admit: 2021-02-10 | Discharge: 2021-02-10 | Disposition: A | Discharge status: Home / Self Care | Payer: Medicare Other | Source: Ambulatory Visit | Attending: Urology | Admitting: Urology

## 2021-02-10 ENCOUNTER — Ambulatory Visit (HOSPITAL_COMMUNITY)
Admission: RE | Admit: 2021-02-10 | Discharge: 2021-02-10 | Payer: Medicare Other | Attending: Urology | Admitting: Urology

## 2021-02-10 ENCOUNTER — Other Ambulatory Visit: Payer: Medicaid Other | Admitting: Urology

## 2021-02-10 ENCOUNTER — Encounter (HOSPITAL_COMMUNITY)
Admission: RE | Disposition: A | Discharge status: Other Acute Inpatient Hospital | Payer: Self-pay | Source: Home / Self Care | Attending: Urology

## 2021-02-10 DIAGNOSIS — Z6837 Body mass index (BMI) 37.0-37.9, adult: Secondary | ICD-10-CM | POA: Diagnosis not present

## 2021-02-10 DIAGNOSIS — H547 Unspecified visual loss: Secondary | ICD-10-CM | POA: Diagnosis not present

## 2021-02-10 DIAGNOSIS — N201 Calculus of ureter: Secondary | ICD-10-CM | POA: Insufficient documentation

## 2021-02-10 DIAGNOSIS — I10 Essential (primary) hypertension: Secondary | ICD-10-CM | POA: Diagnosis not present

## 2021-02-10 DIAGNOSIS — Z9049 Acquired absence of other specified parts of digestive tract: Secondary | ICD-10-CM | POA: Insufficient documentation

## 2021-02-10 DIAGNOSIS — Z20822 Contact with and (suspected) exposure to covid-19: Secondary | ICD-10-CM | POA: Diagnosis not present

## 2021-02-10 DIAGNOSIS — Z841 Family history of disorders of kidney and ureter: Secondary | ICD-10-CM | POA: Diagnosis not present

## 2021-02-10 DIAGNOSIS — Z888 Allergy status to other drugs, medicaments and biological substances status: Secondary | ICD-10-CM | POA: Insufficient documentation

## 2021-02-10 DIAGNOSIS — E669 Obesity, unspecified: Secondary | ICD-10-CM | POA: Insufficient documentation

## 2021-02-10 DIAGNOSIS — E785 Hyperlipidemia, unspecified: Secondary | ICD-10-CM | POA: Diagnosis not present

## 2021-02-10 DIAGNOSIS — Z880 Allergy status to penicillin: Secondary | ICD-10-CM | POA: Insufficient documentation

## 2021-02-10 HISTORY — PX: CYSTOSCOPY WITH RETROGRADE PYELOGRAM, URETEROSCOPY AND STENT PLACEMENT: SHX5789

## 2021-02-10 LAB — RESP PANEL BY RT-PCR (FLU A&B, COVID) ARPGX2
Influenza A by PCR: NEGATIVE
Influenza B by PCR: NEGATIVE
SARS Coronavirus 2 by RT PCR: NEGATIVE

## 2021-02-10 SURGERY — CYSTOURETEROSCOPY, WITH RETROGRADE PYELOGRAM AND STENT INSERTION
Anesthesia: General | Site: Bladder | Laterality: Right

## 2021-02-10 MED ORDER — HYDROCODONE-ACETAMINOPHEN 5-325 MG PO TABS: 1.0000 | ORAL_TABLET | ORAL | 0 refills | Status: DC | PRN

## 2021-02-10 MED ORDER — MIDAZOLAM HCL 2 MG/2ML IJ SOLN
INTRAMUSCULAR | Status: DC | PRN
  Administered 2021-02-10: 1 mg via INTRAVENOUS

## 2021-02-10 MED ORDER — FENTANYL CITRATE (PF) 100 MCG/2ML IJ SOLN
INTRAMUSCULAR | Status: AC
  Filled 2021-02-10: qty 2

## 2021-02-10 MED ORDER — PROPOFOL 10 MG/ML IV BOLUS
INTRAVENOUS | Status: AC
  Filled 2021-02-10: qty 20

## 2021-02-10 MED ORDER — EPHEDRINE 5 MG/ML INJ
INTRAVENOUS | Status: AC
  Filled 2021-02-10: qty 10

## 2021-02-10 MED ORDER — DEXTROSE 5 % IV SOLN
INTRAVENOUS | Status: DC | PRN
  Administered 2021-02-10: 2 g via INTRAVENOUS

## 2021-02-10 MED ORDER — LIDOCAINE HCL (PF) 2 % IJ SOLN
INTRAMUSCULAR | Status: AC
  Filled 2021-02-10: qty 5

## 2021-02-10 MED ORDER — SUCCINYLCHOLINE CHLORIDE 20 MG/ML IJ SOLN
INTRAMUSCULAR | Status: DC | PRN
  Administered 2021-02-10: 140 mg via INTRAVENOUS

## 2021-02-10 MED ORDER — SODIUM CHLORIDE 0.9 % IV SOLN
INTRAVENOUS | Status: AC
  Filled 2021-02-10: qty 20

## 2021-02-10 MED ORDER — SODIUM CHLORIDE 0.9 % IR SOLN
Status: DC | PRN
  Administered 2021-02-10: 3000 mL

## 2021-02-10 MED ORDER — ORAL CARE MOUTH RINSE: 15.0000 mL | Freq: Once | OROMUCOSAL | Status: AC

## 2021-02-10 MED ORDER — DIATRIZOATE MEGLUMINE 30 % UR SOLN
URETHRAL | Status: AC
  Filled 2021-02-10: qty 100

## 2021-02-10 MED ORDER — ONDANSETRON HCL 4 MG/2ML IJ SOLN
INTRAMUSCULAR | Status: AC
  Filled 2021-02-10: qty 2

## 2021-02-10 MED ORDER — ONDANSETRON HCL 4 MG/2ML IJ SOLN: 4.0000 mg | Freq: Once | INTRAMUSCULAR | Status: DC | PRN

## 2021-02-10 MED ORDER — ROCURONIUM BROMIDE 10 MG/ML (PF) SYRINGE
PREFILLED_SYRINGE | INTRAVENOUS | Status: AC
  Filled 2021-02-10: qty 10

## 2021-02-10 MED ORDER — SUCCINYLCHOLINE CHLORIDE 200 MG/10ML IV SOSY
PREFILLED_SYRINGE | INTRAVENOUS | Status: AC
  Filled 2021-02-10: qty 10

## 2021-02-10 MED ORDER — WATER FOR IRRIGATION, STERILE IR SOLN
Status: DC | PRN
  Administered 2021-02-10: 500 mL

## 2021-02-10 MED ORDER — METOCLOPRAMIDE HCL 5 MG/ML IJ SOLN
10.0000 mg | Freq: Once | INTRAMUSCULAR | Status: AC
  Administered 2021-02-10: 10 mg via INTRAVENOUS
  Filled 2021-02-10: qty 2

## 2021-02-10 MED ORDER — CHLORHEXIDINE GLUCONATE 0.12 % MT SOLN
15.0000 mL | Freq: Once | OROMUCOSAL | Status: AC
  Administered 2021-02-10: 15 mL via OROMUCOSAL

## 2021-02-10 MED ORDER — FENTANYL CITRATE (PF) 100 MCG/2ML IJ SOLN
INTRAMUSCULAR | Status: DC | PRN
  Administered 2021-02-10: 100 ug via INTRAVENOUS

## 2021-02-10 MED ORDER — DIATRIZOATE MEGLUMINE 30 % UR SOLN
URETHRAL | Status: DC | PRN
  Administered 2021-02-10: 6 mL via URETHRAL

## 2021-02-10 MED ORDER — MIDAZOLAM HCL 2 MG/2ML IJ SOLN
INTRAMUSCULAR | Status: AC
  Filled 2021-02-10: qty 2

## 2021-02-10 MED ORDER — DEXAMETHASONE SODIUM PHOSPHATE 10 MG/ML IJ SOLN
INTRAMUSCULAR | Status: DC | PRN
  Administered 2021-02-10: 10 mg via INTRAVENOUS

## 2021-02-10 MED ORDER — MEPERIDINE HCL 50 MG/ML IJ SOLN: 6.2500 mg | INTRAMUSCULAR | Status: DC | PRN

## 2021-02-10 MED ORDER — DEXAMETHASONE SODIUM PHOSPHATE 10 MG/ML IJ SOLN
INTRAMUSCULAR | Status: AC
  Filled 2021-02-10: qty 1

## 2021-02-10 MED ORDER — LACTATED RINGERS IV SOLN: INTRAVENOUS | Status: DC

## 2021-02-10 MED ORDER — FENTANYL CITRATE (PF) 100 MCG/2ML IJ SOLN: 25.0000 ug | INTRAMUSCULAR | Status: DC | PRN

## 2021-02-10 MED ORDER — ROCURONIUM BROMIDE 100 MG/10ML IV SOLN
INTRAVENOUS | Status: DC | PRN
  Administered 2021-02-10: 5 mg via INTRAVENOUS

## 2021-02-10 MED ORDER — LIDOCAINE HCL (CARDIAC) PF 50 MG/5ML IV SOSY
PREFILLED_SYRINGE | INTRAVENOUS | Status: DC | PRN
  Administered 2021-02-10: 50 mg via INTRAVENOUS

## 2021-02-10 MED ORDER — ONDANSETRON HCL 4 MG/2ML IJ SOLN
INTRAMUSCULAR | Status: DC | PRN
  Administered 2021-02-10: 4 mg via INTRAVENOUS

## 2021-02-10 MED ORDER — EPHEDRINE SULFATE 50 MG/ML IJ SOLN
INTRAMUSCULAR | Status: DC | PRN
  Administered 2021-02-10: 10 mg via INTRAVENOUS

## 2021-02-10 MED ORDER — PROPOFOL 10 MG/ML IV BOLUS
INTRAVENOUS | Status: DC | PRN
  Administered 2021-02-10: 200 mg via INTRAVENOUS

## 2021-02-10 SURGICAL SUPPLY — 26 items
BAG DRAIN URO TABLE W/ADPT NS (BAG) ×3 IMPLANT
BAG DRN 8 ADPR NS SKTRN CSTL (BAG) ×2
BAG HAMPER (MISCELLANEOUS) ×3 IMPLANT
CATH INTERMIT  6FR 70CM (CATHETERS) ×3 IMPLANT
CLOTH BEACON ORANGE TIMEOUT ST (SAFETY) ×3 IMPLANT
DECANTER SPIKE VIAL GLASS SM (MISCELLANEOUS) ×3 IMPLANT
EXTRACTOR STONE NITINOL NGAGE (UROLOGICAL SUPPLIES) ×2 IMPLANT
GLOVE BIO SURGEON STRL SZ8 (GLOVE) ×3 IMPLANT
GLOVE SURG UNDER POLY LF SZ7 (GLOVE) ×6 IMPLANT
GLOVE SURG UNDER POLY LF SZ7.5 (GLOVE) ×2 IMPLANT
GOWN STRL REUS W/TWL LRG LVL3 (GOWN DISPOSABLE) ×3 IMPLANT
GOWN STRL REUS W/TWL XL LVL3 (GOWN DISPOSABLE) ×3 IMPLANT
GUIDEWIRE STR DUAL SENSOR (WIRE) ×3 IMPLANT
GUIDEWIRE STR ZIPWIRE 035X150 (MISCELLANEOUS) ×3 IMPLANT
IV NS IRRIG 3000ML ARTHROMATIC (IV SOLUTION) ×6 IMPLANT
KIT TURNOVER CYSTO (KITS) ×3 IMPLANT
MANIFOLD NEPTUNE II (INSTRUMENTS) ×3 IMPLANT
PACK CYSTO (CUSTOM PROCEDURE TRAY) ×3 IMPLANT
PAD ARMBOARD 7.5X6 YLW CONV (MISCELLANEOUS) ×5 IMPLANT
STENT URET 6FRX26 CONTOUR (STENTS) ×2 IMPLANT
SYR 10ML LL (SYRINGE) ×3 IMPLANT
SYR CONTROL 10ML LL (SYRINGE) ×3 IMPLANT
TOWEL OR 17X26 4PK STRL BLUE (TOWEL DISPOSABLE) ×3 IMPLANT
TRACTIP FLEXIVA PULS ID 200XHI (Laser) IMPLANT
TRACTIP FLEXIVA PULSE ID 200 (Laser)
WATER STERILE IRR 500ML POUR (IV SOLUTION) ×3 IMPLANT

## 2021-02-10 NOTE — H&P (Signed)
Urology Admission H&P  Chief Complaint: right ureteral calculus  History of Present Illness: [name] is 60yo here for right ureteroscopic stone extraction. He had a ureteral stent placed 12/2020 for sepsis from a 5mm right distal ureteral calculus. No complaints today  Past Medical History:  Diagnosis Date  . Blind   . GERD (gastroesophageal reflux disease)   . Hyperlipidemia   . Hypertension   . Hypoxic brain injury ([place])   . Muscle spasms of head and/or neck    and overall body  . Obesity    Past Surgical History:  Procedure Laterality Date  . CHOLECYSTECTOMY    . CYSTOSCOPY W/ URETERAL STENT PLACEMENT Right 12/14/2020   Procedure: CYSTOSCOPY WITH RETROGRADE PYELOGRAM/URETERAL STENT PLACEMENT;  Surgeon: Robley Fries, MD;  Location: [facility];  Service: Urology;  Laterality: Right;  . ESOPHAGOGASTRODUODENOSCOPY N/A 01/04/2013   DDU:KGURK hiatal hernia/GASTRIC PolypS/MODERATE Non-erosive gastritis/SMALL nodule in the duodenal bulb  . high pressure shunt    . MALONEY DILATION N/A 01/04/2013   Procedure: MALONEY DILATION;  Surgeon: Danie Binder, MD;  Location: [facility];  Service: Endoscopy;  Laterality: N/A;  . MASS EXCISION Left 09/12/2013   Procedure: EXCISION NEOPLASM LEFT SHOULDER;  Surgeon: Scherry Ran, MD;  Location: [facility];  Service: General;  Laterality: Left;  . SAVORY DILATION N/A 01/04/2013   Procedure: SAVORY DILATION;  Surgeon: Danie Binder, MD;  Location: [facility];  Service: Endoscopy;  Laterality: N/A;    Home Medications:  Current Facility-Administered Medications  Medication Dose Route Frequency Provider Last Rate Last Admin  . lactated ringers infusion   Intravenous Continuous Denese Killings, MD 50 mL/hr at 02/10/21 1211 New Bag at 02/10/21 1211  . sodium chloride 0.9 % with cefTRIAXone (ROCEPHIN) ADS Med            Allergies:  Allergies  Allergen Reactions  . Lisinopril     Other reaction(s): "made me loopy"  . Penicillins Hives     Family History  Problem Relation Age of Onset  . Breast cancer Mother   . Kidney failure Father   . Breast cancer Sister    Social History:  reports that he has never smoked. He has never used smokeless tobacco. He reports that he does not drink alcohol and does not use drugs.  Review of Systems  All other systems reviewed and are negative.   Physical Exam:  Vital signs in last 24 hours: Temp:  [99 F (37.2 C)] 99 F (37.2 C) (05/11 1206) Pulse Rate:  [56] 56 (05/11 1206) Resp:  [20] 20 (05/11 1206) BP: (143)/(82) 143/82 (05/11 1206) SpO2:  [98 %] 98 % (05/11 1206) Physical Exam Vitals reviewed.  Constitutional:      Appearance: Normal appearance.  HENT:     Head: Normocephalic and atraumatic.     Nose: Nose normal. No congestion.     Mouth/Throat:     Mouth: Mucous membranes are dry.  Eyes:     Extraocular Movements: Extraocular movements intact.     Pupils: Pupils are equal, round, and reactive to light.  Cardiovascular:     Rate and Rhythm: Normal rate and regular rhythm.  Pulmonary:     Effort: Pulmonary effort is normal. No respiratory distress.  Abdominal:     General: Abdomen is flat. There is no distension.  Musculoskeletal:        General: No swelling. Normal range of motion.     Cervical back: Normal range of motion and neck  supple.  Skin:    General: Skin is warm and dry.  Neurological:     General: No focal deficit present.     Mental Status: He is alert and oriented to person, place, and time.  Psychiatric:        Mood and Affect: Mood normal.        Behavior: Behavior normal.        Thought Content: Thought content normal.        Judgment: Judgment normal.     Laboratory Data:  Results for orders placed or performed during the hospital encounter of 02/10/21 (from the past 24 hour(s))  Resp Panel by RT-PCR (Flu A&B, Covid) Nasopharyngeal Swab     Status: None   Collection Time: 02/10/21 10:45 AM   Specimen: Nasopharyngeal Swab;  Nasopharyngeal(NP) swabs in vial transport medium  Result Value Ref Range   SARS Coronavirus 2 by RT PCR NEGATIVE NEGATIVE   Influenza A by PCR NEGATIVE NEGATIVE   Influenza B by PCR NEGATIVE NEGATIVE   Recent Results (from the past 240 hour(s))  SARS Coronavirus 2 by RT PCR (hospital order, performed in [facility] hospital lab) Nasopharyngeal Nasopharyngeal Swab     Status: None   Collection Time: 02/04/21 10:14 AM   Specimen: Nasopharyngeal Swab  Result Value Ref Range Status   SARS Coronavirus 2 NEGATIVE NEGATIVE Final    Comment: (NOTE) SARS-CoV-2 target nucleic acids are NOT DETECTED.  The SARS-CoV-2 RNA is generally detectable in upper and lower respiratory specimens during the acute phase of infection. The lowest concentration of SARS-CoV-2 viral copies this assay can detect is 250 copies / mL. A negative result does not preclude SARS-CoV-2 infection and should not be used as the sole basis for treatment or other patient management decisions.  A negative result may occur with improper specimen collection / handling, submission of specimen other than nasopharyngeal swab, presence of viral mutation(s) within the areas targeted by this assay, and inadequate number of viral copies (<250 copies / mL). A negative result must be combined with clinical observations, patient history, and epidemiological information.  Fact Sheet for Patients:   StrictlyIdeas.no  Fact Sheet for Healthcare Providers: BankingDealers.co.za  This test is not yet approved or  cleared by the [place] FDA and has been authorized for detection and/or diagnosis of SARS-CoV-2 by FDA under an Emergency Use Authorization (EUA).  This EUA will remain in effect (meaning this test can be used) for the duration of the COVID-19 declaration under Section 564(b)(1) of the Act, 21 U.S.C. section 360bbb-3(b)(1), unless the authorization is terminated or revoked  sooner.  Performed at [facility], [address] [place], [place] [address]   MRSA PCR Screening     Status: None   Collection Time: 02/04/21  3:16 PM   Specimen: Nasal Mucosa; Nasopharyngeal  Result Value Ref Range Status   MRSA by PCR NEGATIVE NEGATIVE Final    Comment:        The GeneXpert MRSA Assay (FDA approved for NASAL specimens only), is one component of a comprehensive MRSA colonization surveillance program. It is not intended to diagnose MRSA infection nor to guide or monitor treatment for MRSA infections. Performed at [facility], [address] [place], [place] [address]   Urine culture     Status: None   Collection Time: 02/05/21  9:30 AM   Specimen: Urine, Clean Catch  Result Value Ref Range Status   Specimen Description   Final    URINE, CLEAN CATCH Performed  at [facility], [address] [place], [place] [address]    Special Requests   Final    NONE Performed at [facility], [address], [place], [place] [address]    Culture   Final    NO GROWTH Performed at [facility], [place] [address] [place], [place] [address]    Report Status 02/07/2021 FINAL  Final  Culture, Respiratory w Gram Stain     Status: None   Collection Time: 02/05/21  8:15 PM   Specimen: SPU  Result Value Ref Range Status   Specimen Description   Final    SPUTUM Performed at [facility], [address], [place], [place] [address]    Special Requests   Final    NONE Performed at [facility], [address], [place], [place] [address]    Gram Stain   Final    ABUNDANT WBC PRESENT, PREDOMINANTLY PMN RARE SQUAMOUS EPITHELIAL CELLS PRESENT RARE GRAM POSITIVE COCCI IN PAIRS RARE GRAM POSITIVE RODS RARE GRAM NEGATIVE RODS    Culture   Final    FEW Normal respiratory flora-no Staph aureus or Pseudomonas seen Performed at [facility], [place] [address] [place], [place] [address]    Report Status 02/08/2021 FINAL  Final  Resp Panel by RT-PCR (Flu A&B, Covid)  Nasopharyngeal Swab     Status: None   Collection Time: 02/10/21 10:45 AM   Specimen: Nasopharyngeal Swab; Nasopharyngeal(NP) swabs in vial transport medium  Result Value Ref Range Status   SARS Coronavirus 2 by RT PCR NEGATIVE NEGATIVE Final    Comment: (NOTE) SARS-CoV-2 target nucleic acids are NOT DETECTED.  The SARS-CoV-2 RNA is generally detectable in upper respiratory specimens during the acute phase of infection. The lowest concentration of SARS-CoV-2 viral copies this assay can detect is 138 copies/mL. A negative result does not preclude SARS-Cov-2 infection and should not be used as the sole basis for treatment or other patient management decisions. A negative result may occur with  improper specimen collection/handling, submission of specimen other than nasopharyngeal swab, presence of viral mutation(s) within the areas targeted by this assay, and inadequate number of viral copies(<138 copies/mL). A negative result must be combined with clinical observations, patient history, and epidemiological information. The expected result is Negative.  Fact Sheet for Patients:  EntrepreneurPulse.com.au  Fact Sheet for Healthcare Providers:  IncredibleEmployment.be  This test is no t yet approved or cleared by the [place] FDA and  has been authorized for detection and/or diagnosis of SARS-CoV-2 by FDA under an Emergency Use Authorization (EUA). This EUA will remain  in effect (meaning this test can be used) for the duration of the COVID-19 declaration under Section 564(b)(1) of the Act, 21 U.S.C.section 360bbb-3(b)(1), unless the authorization is terminated  or revoked sooner.       Influenza A by PCR NEGATIVE NEGATIVE Final   Influenza B by PCR NEGATIVE NEGATIVE Final    Comment: (NOTE) The Xpert Xpress SARS-CoV-2/FLU/RSV plus assay is intended as an aid in the diagnosis of influenza from Nasopharyngeal swab specimens and should not be  used as a sole basis for treatment. Nasal washings and aspirates are unacceptable for Xpert Xpress SARS-CoV-2/FLU/RSV testing.  Fact Sheet for Patients: EntrepreneurPulse.com.au  Fact Sheet for Healthcare Providers: IncredibleEmployment.be  This test is not yet approved or cleared by the [place] FDA and has been authorized for detection and/or diagnosis of SARS-CoV-2 by FDA under an Emergency Use Authorization (EUA). This EUA will remain in effect (meaning  this test can be used) for the duration of the COVID-19 declaration under Section 564(b)(1) of the Act, 21 U.S.C. section 360bbb-3(b)(1), unless the authorization is terminated or revoked.  Performed at [facility], [address], [place], [place] [address]    Creatinine: Recent Labs    02/04/21 1536 02/05/21 0456 02/06/21 0431 02/08/21 0401  CREATININE 0.68 0.83 0.75 0.70   Baseline Creatinine: 0.7  Impression/Assessment:  59yo with right ureteral calculus  Plan:  -We discussed the management of kidney stones. These options include observation, ureteroscopy, shockwave lithotripsy (ESWL) and percutaneous nephrolithotomy (PCNL). We discussed which options are relevant to the patient's stone(s). We discussed the natural history of kidney stones as well as the complications of untreated stones and the impact on quality of life without treatment as well as with each of the above listed treatments. We also discussed the efficacy of each treatment in its ability to clear the stone burden. With any of these management options I discussed the signs and symptoms of infection and the need for emergent treatment should these be experienced. For each option we discussed the ability of each procedure to clear the patient of their stone burden.   For observation I described the risks which include but are not limited to silent renal damage, life-threatening infection, need for emergent surgery,  failure to pass stone and pain.   For ureteroscopy I described the risks which include bleeding, infection, damage to contiguous structures, positioning injury, ureteral stricture, ureteral avulsion, ureteral injury, need for prolonged ureteral stent, inability to perform ureteroscopy, need for an interval procedure, inability to clear stone burden, stent discomfort/pain, heart attack, stroke, pulmonary embolus and the inherent risks with general anesthesia.   For shockwave lithotripsy I described the risks which include arrhythmia, kidney contusion, kidney hemorrhage, need for transfusion, pain, inability to adequately break up stone, inability to pass stone fragments, Steinstrasse, infection associated with obstructing stones, need for alternate surgical procedure, need for repeat shockwave lithotripsy, MI, CVA, PE and the inherent risks with anesthesia/conscious sedation.   For PCNL I described the risks including positioning injury, pneumothorax, hydrothorax, need for chest tube, inability to clear stone burden, renal laceration, arterial venous fistula or malformation, need for embolization of kidney, loss of kidney or renal function, need for repeat procedure, need for prolonged nephrostomy tube, ureteral avulsion, MI, CVA, PE and the inherent risks of general anesthesia.   - The patient would like to proceed with Right ureteroscopic stone extraction  Nicolette Bang 02/10/2021, 12:56 PM

## 2021-02-10 NOTE — Anesthesia Procedure Notes (Signed)
Procedure Name: Intubation Date/Time: 02/10/2021 2:02 PM Performed by: Alvy Bimler, CRNA Pre-anesthesia Checklist: Patient identified, Emergency Drugs available, Suction available and Patient being monitored Patient Re-evaluated:Patient Re-evaluated prior to induction Oxygen Delivery Method: Circle system utilized Preoxygenation: Pre-oxygenation with 100% oxygen Induction Type: IV induction, Rapid sequence and Cricoid Pressure applied Laryngoscope Size: 3 and Glidescope Grade View: Grade II Tube type: Oral Laser Tube: Cuffed inflated with minimal occlusive pressure - saline Tube size: 7.5 mm Number of attempts: 1 Airway Equipment and Method: Stylet Placement Confirmation: ETT inserted through vocal cords under direct vision,  positive ETCO2 and breath sounds checked- equal and bilateral Secured at: 21 (right lip) cm Tube secured with: Tape Dental Injury: Teeth and Oropharynx as per pre-operative assessment

## 2021-02-10 NOTE — Discharge Instructions (Signed)
Ureteral Stent Implantation, Care After This sheet gives you information about how to care for yourself after your procedure. Your health care provider may also give you more specific instructions. If you have problems or questions, contact your health care provider. What can I expect after the procedure? After the procedure, it is common to have:  Nausea.  Mild pain when you urinate. You may feel this pain in your lower back or lower abdomen. The pain should stop within a few minutes after you urinate. This may last for up to 1 week.  A small amount of blood in your urine for several days. Follow these instructions at home: Medicines  Take over-the-counter and prescription medicines only as told by your health care provider.  If you were prescribed an antibiotic medicine, take it as told by your health care provider. Do not stop taking the antibiotic even if you start to feel better.  Do not drive for 24 hours if you were given a sedative during your procedure.  Ask your health care provider if the medicine prescribed to you requires you to avoid driving or using heavy machinery. Activity  Rest as told by your health care provider.  Avoid sitting for a long time without moving. Get up to take short walks every 1-2 hours. This is important to improve blood flow and breathing. Ask for help if you feel weak or unsteady.  Return to your normal activities as told by your health care provider. Ask your health care provider what activities are safe for you. General instructions  Watch for any blood in your urine. Call your health care provider if the amount of blood in your urine increases.  If you have a catheter: ? Follow instructions from your health care provider about taking care of your catheter and collection bag. ? Do not take baths, swim, or use a hot tub until your health care provider approves. Ask your health care provider if you may take showers. You may only be allowed to  take sponge baths.  Drink enough fluid to keep your urine pale yellow.  Do not use any products that contain nicotine or tobacco, such as cigarettes, e-cigarettes, and chewing tobacco. These can delay healing after surgery. If you need help quitting, ask your health care provider.  Keep all follow-up visits as told by your health care provider. This is important.   Contact a health care provider if:  You have pain that gets worse or does not get better with medicine, especially pain when you urinate.  You have difficulty urinating.  You feel nauseous or you vomit repeatedly during a period of more than 2 days after the procedure. Get help right away if:  Your urine is dark red or has blood clots in it.  You are leaking urine (have incontinence).  The end of the stent comes out of your urethra.  You cannot urinate.  You have sudden, sharp, or severe pain in your abdomen or lower back.  You have a fever.  You have swelling or pain in your legs.  You have difficulty breathing. Summary  After the procedure, it is common to have mild pain when you urinate that goes away within a few minutes after you urinate. This may last for up to 1 week.  Watch for any blood in your urine. Call your health care provider if the amount of blood in your urine increases.  Take over-the-counter and prescription medicines only as told by your health care provider.  Drink enough fluid to keep your urine pale yellow. This information is not intended to replace advice given to you by your health care provider. Make sure you discuss any questions you have with your health care provider. Document Revised: 06/26/2018 Document Reviewed: 06/27/2018 Elsevier Patient Education  2021 Elsevier Inc.  PLEASE REMOVE YOUR STENT IN 72 HOURS BY GENTLY PULLING THE STRING 

## 2021-02-10 NOTE — Anesthesia Preprocedure Evaluation (Addendum)
Anesthesia Evaluation  Patient identified by MRN, date of birth, ID band Patient awake    Reviewed: Allergy & Precautions, NPO status , Patient's Chart, lab work & pertinent test results, reviewed documented beta blocker date and time   History of Anesthesia Complications (+) history of anesthetic complications (aspiration)  Airway Mallampati: II  TM Distance: >3 FB Neck ROM: Full    Dental  (+) Dental Advisory Given, Teeth Intact   Pulmonary neg pulmonary ROS,    Pulmonary exam normal breath sounds clear to auscultation       Cardiovascular Exercise Tolerance: Poor METS (poor lower extremity strength): hypertension, Pt. on medications and Pt. on home beta blockers Normal cardiovascular exam Rhythm:Regular Rate:Normal     Neuro/Psych Hypoxic brain injury  Neuromuscular disease    GI/Hepatic Neg liver ROS, GERD  Medicated and Poorly Controlled,  Endo/Other  negative endocrine ROS  Renal/GU Renal disease (stones )     Musculoskeletal negative musculoskeletal ROS (+)   Abdominal   Peds  Hematology negative hematology ROS (+)   Anesthesia Other Findings  hypoxic brain injury, blindness  Reproductive/Obstetrics negative OB ROS                           Anesthesia Physical  Anesthesia Plan  ASA: III  Anesthesia Plan: General   Post-op Pain Management:    Induction: Intravenous, Cricoid pressure planned and Rapid sequence  PONV Risk Score and Plan: 4 or greater and Ondansetron, Dexamethasone and Midazolam  Airway Management Planned: Oral ETT  Additional Equipment:   Intra-op Plan:   Post-operative Plan: Extubation in OR  Informed Consent: I have reviewed the patients History and Physical, chart, labs and discussed the procedure including the risks, benefits and alternatives for the proposed anesthesia with the patient or authorized representative who has indicated his/her  understanding and acceptance.     Dental advisory given  Plan Discussed with: CRNA and Surgeon  Anesthesia Plan Comments:         Anesthesia Quick Evaluation

## 2021-02-10 NOTE — Transfer of Care (Signed)
Immediate Anesthesia Transfer of Care Note  Patient: [name]  Procedure(s) Performed: CYSTOSCOPY WITH RETROGRADE PYELOGRAM, DIAGNOSTIC URETEROSCOPY AND STENT EXCHANGE (Right Bladder)  Patient Location: PACU  Anesthesia Type:General  Level of Consciousness: awake, alert , oriented and patient cooperative  Airway & Oxygen Therapy: Patient Spontanous Breathing and Patient connected to face mask oxygen  Post-op Assessment: Report given to RN, Post -op Vital signs reviewed and stable and Patient moving all extremities X 4  Post vital signs: Reviewed and stable  Last Vitals:  Vitals Value Taken Time  BP 125/83 02/10/21 1447  Temp    Pulse 66 02/10/21 1452  Resp 20 02/10/21 1452  SpO2 98 % 02/10/21 1452  Vitals shown include unvalidated device data.  Last Pain:  Vitals:   02/10/21 1206  TempSrc: Oral  PainSc: 5       Patients Stated Pain Goal: 5 (94/07/68 0881)  Complications: No complications documented.

## 2021-02-10 NOTE — Anesthesia Postprocedure Evaluation (Signed)
Anesthesia Post Note  Patient: [name]  Procedure(s) Performed: CYSTOSCOPY WITH RETROGRADE PYELOGRAM, DIAGNOSTIC URETEROSCOPY AND STENT EXCHANGE (Right Bladder)  Patient location during evaluation: PACU Anesthesia Type: General Level of consciousness: awake and alert and sedated Pain management: pain level controlled Vital Signs Assessment: post-procedure vital signs reviewed and stable Respiratory status: spontaneous breathing and respiratory function stable Cardiovascular status: blood pressure returned to baseline and stable Postop Assessment: no apparent nausea or vomiting Anesthetic complications: no   No complications documented.   Last Vitals:  Vitals:   02/10/21 1500 02/10/21 1515  BP: 114/74 132/90  Pulse: 64 62  Resp: 14 (!) 21  Temp:    SpO2: 95% 94%    Last Pain:  Vitals:   02/10/21 1515  TempSrc:   PainSc: 0-No pain                 Chriselda Leppert C Breda Bond

## 2021-02-10 NOTE — Op Note (Signed)
Preoperative diagnosis: Right ureteral calculus  Postoperative diagnosis: Same  Procedure: 1 cystoscopy 2.  right retrograde pyelography 3.  Intraoperative fluoroscopy, under one hour, with interpretation 4.  Right diagnostic ureteroscopy 5. Right 6x26 JJ ureteral stent placement   Attending: Rosie Fate  Anesthesia: General  Estimated blood loss: None  Drains: right 6x26 JJ ureteral stent with tether  Specimens: none  Antibiotics: rocephin  Findings: No right hydronephrosis. No stone located on ureteroscopy to the UPJ  Indications: Patient is a 60 year old male with a history of right ureteral calculus who underwent stent placement in March 2022.Marland Kitchen  After discussing treatment options, she decided proceed with right ureteroscopic stone extraction  Procedure in detail: The patient was brought to the operating room and a brief timeout was done to ensure correct patient, correct procedure, correct site.  General anesthesia was administered patient was placed in dorsal lithotomy position.  Her genitalia was then prepped and draped in usual sterile fashion.  A rigid 22 French cystoscope was passed in the urethra and the bladder.  Bladder was inspected free masses or lesions.  the right ureteral orifices were in the normal orthotopic locations.  a 6 french ureteral catheter was then instilled into the right ureter orifice.  a gentle retrograde was obtained and findings noted above. Using a grasper the right ureteral stent was brought to the urethral meatus.  we then placed a zip wire through the ureteral stent and advanced up to the renal pelvis.  We then removed the stent. we then removed the cystoscope and cannulated the right ureteral orifice with a semirigid ureteroscope.  we then performed ureteroscopy up to the level of the UPJ. No stone or tumor was encountered. We then elected to place a stent over the zipwire. A 6x26 JJ ureteral stent was advanced to the renal pelvis. The wire was  then removed and good coil was noted in the renal pelvis under fluoroscopy and the bladder under direct vision. the bladder was then drained and this concluded the procedure which was well tolerated by patient.  Complications: None  Condition: Stable, extubated, transferred to PACU  Plan: Pt is to followup in 2 weeks. He is to remove his stent in 72 hours by pulling the tether

## 2021-02-11 ENCOUNTER — Telehealth: Payer: Self-pay

## 2021-02-11 ENCOUNTER — Other Ambulatory Visit: Payer: Self-pay | Admitting: Urology

## 2021-02-11 ENCOUNTER — Encounter (HOSPITAL_COMMUNITY): Payer: Self-pay | Admitting: Urology

## 2021-02-11 MED ORDER — HYDROCODONE-ACETAMINOPHEN 5-325 MG PO TABS: 1.0000 | ORAL_TABLET | ORAL | 0 refills | Status: AC | PRN

## 2021-02-11 NOTE — Telephone Encounter (Signed)
Tye Maryland called and made aware that medication will be sent to preferred pharmacy.

## 2021-02-11 NOTE — Telephone Encounter (Signed)
Left a voice message:  Tye Maryland from care center calling for patient. His medication was called to the wrong pharmacy.  Please call Tye Maryland back at [phone]  Thanks, Helene Kelp

## 2021-02-16 ENCOUNTER — Encounter: Payer: Medicaid Other | Admitting: Urology

## 2021-02-18 LAB — CALCULI, WITH PHOTOGRAPH (CLINICAL LAB)
Calcium Oxalate Monohydrate: 100 %
Size Calculi: <1 mm
Weight Calculi: <1 mg

## 2021-03-02 ENCOUNTER — Ambulatory Visit: Payer: Medicaid Other | Admitting: Urology

## 2021-03-08 ENCOUNTER — Telehealth: Payer: Self-pay | Admitting: Urology

## 2021-03-08 NOTE — Telephone Encounter (Signed)
Patient mom called to let us know that he is having a slight cough but nothing coming up and is unable to talk above a whisper. I let her know that if she was concerned that she could have him go to Urgent Care or the Emergency Room for evaluation. She is aware that he has an appointment on 03/16/21. Please advise if any other recommendations.

## 2021-03-11 NOTE — Telephone Encounter (Signed)
I attempted to call back with recommendations and was not able to leave a message.

## 2021-03-16 ENCOUNTER — Telehealth: Payer: Medicaid Other | Admitting: Urology

## 2021-03-16 ENCOUNTER — Other Ambulatory Visit: Payer: Self-pay

## 2021-03-31 ENCOUNTER — Ambulatory Visit: Payer: Medicaid Other | Admitting: Urology

## 2021-07-31 ENCOUNTER — Other Ambulatory Visit: Payer: Self-pay

## 2021-07-31 ENCOUNTER — Emergency Department (HOSPITAL_COMMUNITY): Payer: Medicare Other

## 2021-07-31 ENCOUNTER — Emergency Department (HOSPITAL_COMMUNITY)
Admission: EM | Admit: 2021-07-31 | Discharge: 2021-07-31 | Disposition: A | Discharge status: Home / Self Care | Payer: Medicare Other | Attending: Emergency Medicine | Admitting: Emergency Medicine

## 2021-07-31 DIAGNOSIS — I10 Essential (primary) hypertension: Secondary | ICD-10-CM | POA: Insufficient documentation

## 2021-07-31 DIAGNOSIS — Y92009 Unspecified place in unspecified non-institutional (private) residence as the place of occurrence of the external cause: Secondary | ICD-10-CM | POA: Diagnosis not present

## 2021-07-31 DIAGNOSIS — S20211A Contusion of right front wall of thorax, initial encounter: Secondary | ICD-10-CM | POA: Diagnosis not present

## 2021-07-31 DIAGNOSIS — Z79899 Other long term (current) drug therapy: Secondary | ICD-10-CM | POA: Insufficient documentation

## 2021-07-31 DIAGNOSIS — S299XXA Unspecified injury of thorax, initial encounter: Secondary | ICD-10-CM | POA: Diagnosis present

## 2021-07-31 DIAGNOSIS — Z7982 Long term (current) use of aspirin: Secondary | ICD-10-CM | POA: Diagnosis not present

## 2021-07-31 DIAGNOSIS — W01198A Fall on same level from slipping, tripping and stumbling with subsequent striking against other object, initial encounter: Secondary | ICD-10-CM | POA: Insufficient documentation

## 2021-07-31 NOTE — ED Notes (Signed)
[employer] called for transportation.

## 2021-07-31 NOTE — ED Notes (Signed)
Mother called and is wondering why patient has not been transferred as of yet, informed that no trucks available at this time. Brother has agreed to come get patient. Truck canceled.

## 2021-07-31 NOTE — ED Notes (Signed)
Patient brought in via EMS from [place] with mother at bedside. Patient alert and oriented. Airway patent. Patient c/o right upper flank pain that started after fall 2 weeks ago. Per mother patient was assessed by paramedics directly after fall but refused to be transported to be seen due to all vitals being stable with no pain at the time. Patient denies hitting head or LOC. Patient reports pain with palpitation and deep breath. Lung sounds clear.

## 2021-07-31 NOTE — Discharge Instructions (Signed)
Your x-ray is negative for any broken ribs, however a contusion of the ribs can be equally painful without there actually been a fracture.  I recommend continuing to use Tylenol or ibuprofen if needed for pain relief.  You may also benefit by using a heating pad applied to your rib cage area for 20 minutes several times daily.  Follow-up with your primary doctor for recheck if symptoms are not improving over the next 10 to 14 days.

## 2021-07-31 NOTE — ED Provider Notes (Signed)
[name] EMERGENCY DEPARTMENT Provider Note   CSN: [id number] Arrival date & time: 07/31/21  1328     History Chief Complaint  Patient presents with   [name] is a 60 y.o. male with a history including hypoxic brain injury who is wheelchair-bound and blind presenting from a local respite home, mother at bedside with complaints of right-sided chest pain which has been present for several weeks since falling.  He states that he slipped on slippery flooring as his residence was replacing flooring materials and he was sitting on the edge of his bed when he slipped and fell landing against his right rib cage.  He has had pain since this event which is worsened with certain movements and deep inspiration.  He denies shortness of breath, also denies nausea or vomiting or abdominal pain.  He has been receiving Tylenol which he states helps some.  He has had no fevers or chills, no cough.  He also denies neck or back pain, denies head injury.  The history is provided by the patient.      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   Right ureteral calculus 12/14/2020   Pyelonephritis 12/14/2020   Essential hypertension 12/14/2020   Mass of shoulder region 09/12/2013   Dysphagia 12/20/2012   GERD (gastroesophageal reflux disease) 12/20/2012    Past Surgical History:  Procedure Laterality Date    CHOLECYSTECTOMY     CYSTOSCOPY W/ URETERAL STENT PLACEMENT Right 12/14/2020   Procedure: CYSTOSCOPY WITH RETROGRADE PYELOGRAM/URETERAL STENT PLACEMENT;  Surgeon: Robley Fries, MD;  Location: [facility];  Service: Urology;  Laterality: Right;   CYSTOSCOPY WITH RETROGRADE PYELOGRAM, URETEROSCOPY AND STENT PLACEMENT Right 02/10/2021   Procedure: CYSTOSCOPY WITH RETROGRADE PYELOGRAM, DIAGNOSTIC URETEROSCOPY AND STENT EXCHANGE;  Surgeon: Cleon Gustin, MD;  Location: [facility];  Service: Urology;  Laterality: Right;   ESOPHAGOGASTRODUODENOSCOPY N/A 01/04/2013   RSW:NIOEV hiatal hernia/GASTRIC PolypS/MODERATE Non-erosive gastritis/SMALL nodule in the duodenal bulb   high pressure shunt     MALONEY DILATION N/A 01/04/2013   Procedure: MALONEY DILATION;  Surgeon: Danie Binder, MD;  Location: [facility];  Service: Endoscopy;  Laterality: N/A;   MASS EXCISION Left 09/12/2013   Procedure: EXCISION NEOPLASM LEFT SHOULDER;  Surgeon: Scherry Ran, MD;  Location: [facility];  Service: General;  Laterality: Left;   SAVORY DILATION N/A 01/04/2013   Procedure: SAVORY DILATION;  Surgeon: Danie Binder, MD;  Location: [facility];  Service: Endoscopy;  Laterality: N/A;       Family History  Problem Relation Age of Onset   Breast cancer Mother    Kidney failure Father    Breast cancer Sister     Social History   Tobacco Use   Smoking status: Never   Smokeless tobacco: Never  Substance Use Topics  Alcohol use: No   Drug use: No    Home Medications Prior to Admission medications   Medication Sig Start Date End Date Taking? Authorizing Provider  amLODipine (NORVASC) 10 MG tablet Take 10 mg by mouth daily with breakfast.   Yes [provider]  aspirin EC 81 MG tablet Take 81 mg by mouth daily with breakfast. Swallow whole.   Yes [provider]  atorvastatin (LIPITOR) 40 MG tablet Take 40 mg by mouth at bedtime. 12/05/12  Yes [provider]  cholecalciferol (VITAMIN  D3) 25 MCG (1000 UNIT) tablet Take 1,000 Units by mouth daily with breakfast.   Yes [provider]  cyclobenzaprine (FLEXERIL) 5 MG tablet Take 5 mg by mouth 3 (three) times daily. 12/10/20  Yes [provider]  escitalopram (LEXAPRO) 10 MG tablet Take 10 mg by mouth at bedtime.   Yes [provider]  esomeprazole (NEXIUM) 40 MG capsule Take 40 mg by mouth daily with breakfast.   Yes [provider]  guaiFENesin (MUCINEX) 600 MG 12 hr tablet Take 1 tablet (600 mg total) by mouth 2 (two) times daily. 02/08/21  Yes Roxan Hockey, MD  magnesium oxide (MAG-OX) 400 MG tablet Take 400 mg by mouth daily with breakfast. 11/04/20  Yes [provider]  meclizine (ANTIVERT) 25 MG tablet Take 25 mg by mouth every 6 (six) hours as needed for dizziness.   Yes [provider]  Multiple Vitamin (MULTIVITAMIN WITH MINERALS) TABS tablet Take 1 tablet by mouth daily with breakfast.   Yes [provider]  polyethylene glycol (MIRALAX / GLYCOLAX) 17 g packet Place 17 g into feeding tube daily. 02/09/21  Yes Roxan Hockey, MD  potassium chloride (K-DUR) 10 MEQ tablet Take 10 mEq by mouth daily with breakfast. 12/05/12  Yes [provider]  propranolol (INDERAL) 40 MG tablet Take 40 mg by mouth 2 (two) times daily. 12/24/20  Yes [provider]  riboflavin (VITAMIN B-2) 100 MG TABS tablet Take 400 mg by mouth daily with breakfast. 12/09/20  Yes [provider]  acetaminophen (TYLENOL) 325 MG tablet Take 2 tablets (650 mg total) by mouth every 6 (six) hours as needed for mild pain, fever or headache (or Fever >/= 101). 02/08/21   Roxan Hockey, MD  HYDROcodone-acetaminophen (NORCO) 5-325 MG tablet Take 1 tablet by mouth every 6 (six) hours as needed for moderate pain. 01/05/21   McKenzie, Candee Furbish, MD  HYDROcodone-acetaminophen (NORCO/VICODIN) 5-325 MG tablet Take 1 tablet by mouth every 4 (four) hours as needed for moderate pain. 02/11/21  02/11/22  McKenzie, Candee Furbish, MD  SUMAtriptan (IMITREX) 100 MG tablet Take 100 mg by mouth See admin instructions. Take 100mg  at onset of migraine. If no relief, may repeat in 2 hours. Max 2 doses in 24 hours 12/24/20   [provider]    Allergies    Lisinopril and Penicillins  Review of Systems   Review of Systems  Constitutional:  Negative for chills and fever.  HENT:  Negative for congestion and sore throat.   Eyes: Negative.   Respiratory:  Negative for cough, chest tightness and shortness of breath.   Cardiovascular:  Positive for chest pain.  Gastrointestinal:  Negative for abdominal pain, nausea and vomiting.  Genitourinary: Negative.   Musculoskeletal:  Negative for arthralgias, joint swelling and neck pain.  Skin: Negative.  Negative for rash and wound.  Neurological:  Negative for dizziness, weakness, light-headedness, numbness and headaches.  Psychiatric/Behavioral: Negative.    All other systems reviewed  and are negative.  Physical Exam Updated Vital Signs BP 124/72   Pulse (!) 59   Temp 99.4 F (37.4 C) (Oral)   Resp 16   Ht 5\' 6"  (1.676 m)   Wt 101.6 kg   SpO2 93%   BMI 36.15 kg/m   Physical Exam Vitals and nursing note reviewed.  Constitutional:      Appearance: He is well-developed.  HENT:     Head: Normocephalic and atraumatic.  Eyes:     Conjunctiva/sclera: Conjunctivae normal.  Cardiovascular:     Rate and Rhythm: Normal rate and regular rhythm.     Heart sounds: Normal heart sounds.  Pulmonary:     Effort: Pulmonary effort is normal.     Breath sounds: Normal breath sounds. No wheezing.  Chest:       Comments: Tenderness to palpation along the right lower chest wall mid axillary line.  There is no palpable deformity. Abdominal:     General: Bowel sounds are normal.     Palpations: Abdomen is soft.     Tenderness: There is no abdominal tenderness. There is no guarding.  Musculoskeletal:        General: Normal range of motion.      Cervical back: Normal range of motion.  Skin:    General: Skin is warm and dry.  Neurological:     Mental Status: He is alert.    ED Results / Procedures / Treatments   Labs (all labs ordered are listed, but only abnormal results are displayed) Labs Reviewed - No data to display  EKG None  Radiology DG Ribs Unilateral W/Chest Right  Result Date: 07/31/2021 CLINICAL DATA:  [place] yesterday.  Right-sided rib pain. EXAM: RIGHT RIBS AND CHEST - 3+ VIEW COMPARISON:  Chest x-ray 02/08/2021 FINDINGS: The cardiac silhouette, mediastinal and hilar contours are within normal limits and stable. The lungs are clear of an acute process. No pulmonary lesions or pleural effusions. Dedicated views of the right ribs do not demonstrate any definite acute right-sided rib fractures. IMPRESSION: No acute cardiopulmonary findings and no definite acute right-sided rib fractures. Electronically Signed   By: Marijo Sanes M.D.   On: 07/31/2021 15:13    Procedures Procedures   Medications Ordered in ED Medications - No data to display  ED Course  I have reviewed the triage vital signs and the nursing notes.  Pertinent labs & imaging results that were available during my care of the patient were reviewed by me and considered in my medical decision making (see chart for details).    MDM Rules/Calculators/A&P                           Imaging reviewed and discussed with patient and parent at bedside.  He has no obvious rib fractures, he does have localized pain to palpation at his right lower rib cage, he does not have any abdominal pain or CVA tenderness.  I suspect he may have either an occult rib fracture or a rib contusion, but these possibilities was discussed with patient and mother.  We discussed home treatment, he does get some relief from Tylenol, encouraged for him to continue this medicine, also adding ibuprofen, discussed heat therapy.  Plan for close follow-up with his PCP if symptoms are not  continuing to improve over the next 10 to 14 days. Final Clinical Impression(s) / ED Diagnoses Final diagnoses:  Contusion of right chest wall, initial encounter    Rx /  DC Orders ED Discharge Orders     None        Landis Martins 07/31/21 Zinc, [place], DO 08/01/21 1659

## 2021-07-31 NOTE — ED Notes (Signed)
Discharge instructions reviewed with patient, mother, and facility staff [name]. All verbalized understanding.

## 2021-08-11 ENCOUNTER — Encounter: Payer: Self-pay | Admitting: Internal Medicine

## 2021-08-23 ENCOUNTER — Other Ambulatory Visit (HOSPITAL_COMMUNITY): Payer: Self-pay | Admitting: Family Medicine

## 2021-08-23 ENCOUNTER — Other Ambulatory Visit: Payer: Self-pay | Admitting: Family Medicine

## 2021-08-23 DIAGNOSIS — R109 Unspecified abdominal pain: Secondary | ICD-10-CM

## 2021-08-30 ENCOUNTER — Ambulatory Visit (HOSPITAL_COMMUNITY): Payer: Medicare Other

## 2021-09-02 ENCOUNTER — Ambulatory Visit (HOSPITAL_COMMUNITY): Payer: Medicare Other

## 2021-09-02 ENCOUNTER — Encounter (HOSPITAL_COMMUNITY): Payer: Self-pay

## 2021-09-06 ENCOUNTER — Other Ambulatory Visit: Payer: Self-pay

## 2021-09-06 ENCOUNTER — Ambulatory Visit (HOSPITAL_COMMUNITY)
Admission: RE | Admit: 2021-09-06 | Discharge: 2021-09-06 | Disposition: A | Discharge status: Home / Self Care | Payer: Medicare Other | Source: Ambulatory Visit | Attending: Family Medicine | Admitting: Family Medicine

## 2021-09-06 DIAGNOSIS — R109 Unspecified abdominal pain: Secondary | ICD-10-CM | POA: Diagnosis present

## 2021-09-14 ENCOUNTER — Other Ambulatory Visit: Payer: Self-pay | Admitting: Family Medicine

## 2021-09-14 ENCOUNTER — Other Ambulatory Visit (HOSPITAL_COMMUNITY): Payer: Self-pay | Admitting: Family Medicine

## 2021-09-14 DIAGNOSIS — S2249XA Multiple fractures of ribs, unspecified side, initial encounter for closed fracture: Secondary | ICD-10-CM

## 2021-09-14 DIAGNOSIS — R10A0 Flank pain, unspecified side: Secondary | ICD-10-CM

## 2021-09-14 DIAGNOSIS — N3942 Incontinence without sensory awareness: Secondary | ICD-10-CM

## 2021-09-14 DIAGNOSIS — R109 Unspecified abdominal pain: Secondary | ICD-10-CM

## 2021-09-15 ENCOUNTER — Other Ambulatory Visit: Payer: Self-pay

## 2021-09-24 ENCOUNTER — Other Ambulatory Visit (HOSPITAL_COMMUNITY): Payer: Medicaid Other

## 2021-09-24 ENCOUNTER — Encounter (HOSPITAL_COMMUNITY): Payer: Self-pay

## 2021-09-29 ENCOUNTER — Other Ambulatory Visit (HOSPITAL_COMMUNITY): Payer: Self-pay | Admitting: Family Medicine

## 2021-09-29 DIAGNOSIS — S2249XA Multiple fractures of ribs, unspecified side, initial encounter for closed fracture: Secondary | ICD-10-CM

## 2021-09-29 DIAGNOSIS — R109 Unspecified abdominal pain: Secondary | ICD-10-CM

## 2021-10-01 ENCOUNTER — Ambulatory Visit (HOSPITAL_COMMUNITY)
Admission: RE | Admit: 2021-10-01 | Discharge: 2021-10-01 | Disposition: A | Discharge status: Home / Self Care | Payer: Medicare Other | Source: Ambulatory Visit | Attending: Family Medicine | Admitting: Family Medicine

## 2021-10-01 ENCOUNTER — Other Ambulatory Visit: Payer: Self-pay

## 2021-10-01 DIAGNOSIS — R109 Unspecified abdominal pain: Secondary | ICD-10-CM | POA: Diagnosis not present

## 2021-10-01 DIAGNOSIS — S2249XA Multiple fractures of ribs, unspecified side, initial encounter for closed fracture: Secondary | ICD-10-CM | POA: Diagnosis present

## 2021-10-01 DIAGNOSIS — N3942 Incontinence without sensory awareness: Secondary | ICD-10-CM | POA: Diagnosis present

## 2021-10-01 DIAGNOSIS — R10A0 Flank pain, unspecified side: Secondary | ICD-10-CM

## 2021-10-14 ENCOUNTER — Ambulatory Visit: Payer: Medicaid Other | Admitting: Urology

## 2021-11-03 DEATH — deceased

## 2021-12-22 ENCOUNTER — Ambulatory Visit: Payer: Medicaid Other | Admitting: Gastroenterology

## 2022-06-08 IMAGING — DX DG CHEST 1V PORT
1 series · 1 of 1 positions shown · non-contrast
Comparison: 02/04/2021

CLINICAL DATA: Shortness of breath

EXAM:
PORTABLE CHEST 1 VIEW

[chest ap]
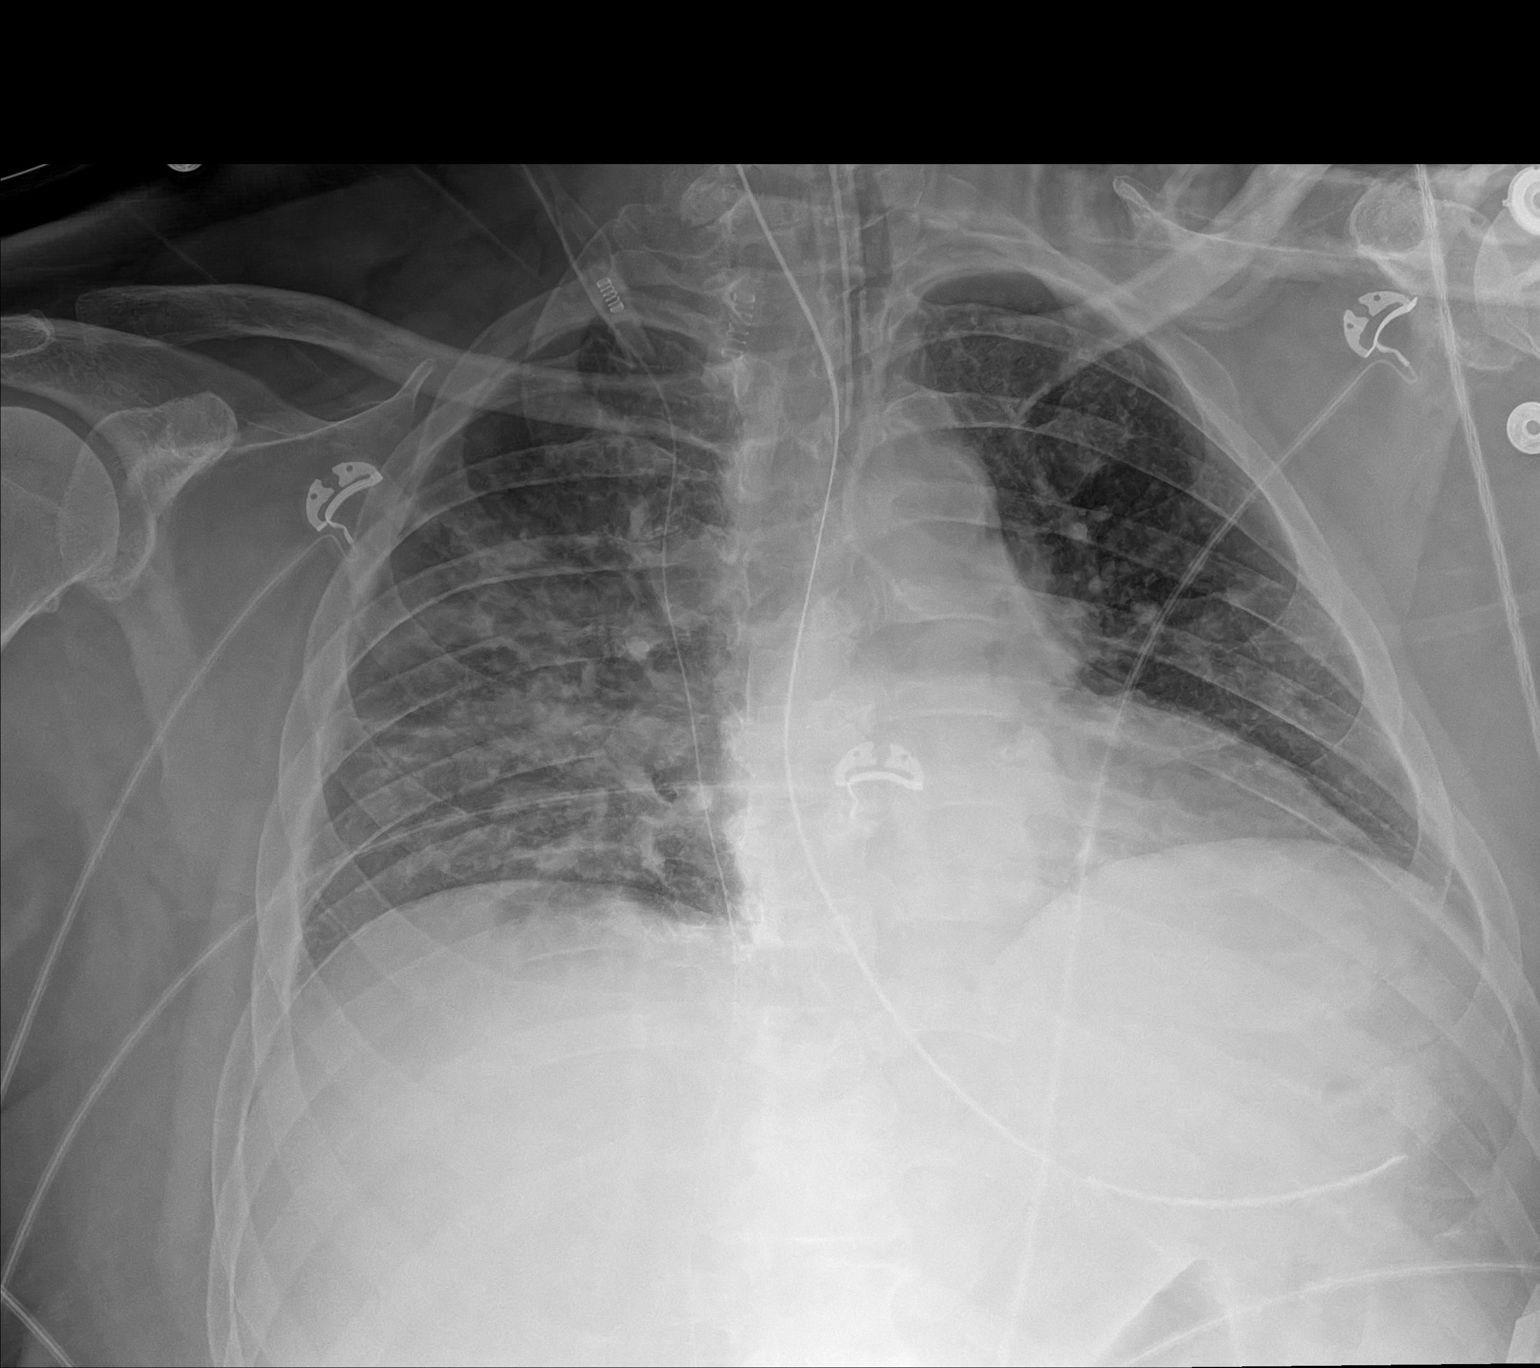

[1 of 1 positions shown; findings below may reference images not displayed]

FINDINGS: Endotracheal tube terminates 3.6 cm above the carina. Enteric tube
remains appropriately position within the gastric body. Stable heart
size. Atherosclerotic calcification of the aortic knob. Increasing
airspace opacities throughout the right lung. No pleural effusion or
pneumothorax.
IMPRESSION: 1. Increasing airspace opacities throughout the right lung may
reflect asymmetric edema versus pneumonia.
2. Stable lines and tubes, as above.

## 2022-06-09 IMAGING — DX DG CHEST 2V
2 series · 2 of 2 positions shown · non-contrast
Comparison: 02/05/2021 and older exams.

CLINICAL DATA: Dyspnea.  History of hypertension.

EXAM:
CHEST - 2 VIEW

[chest lat]
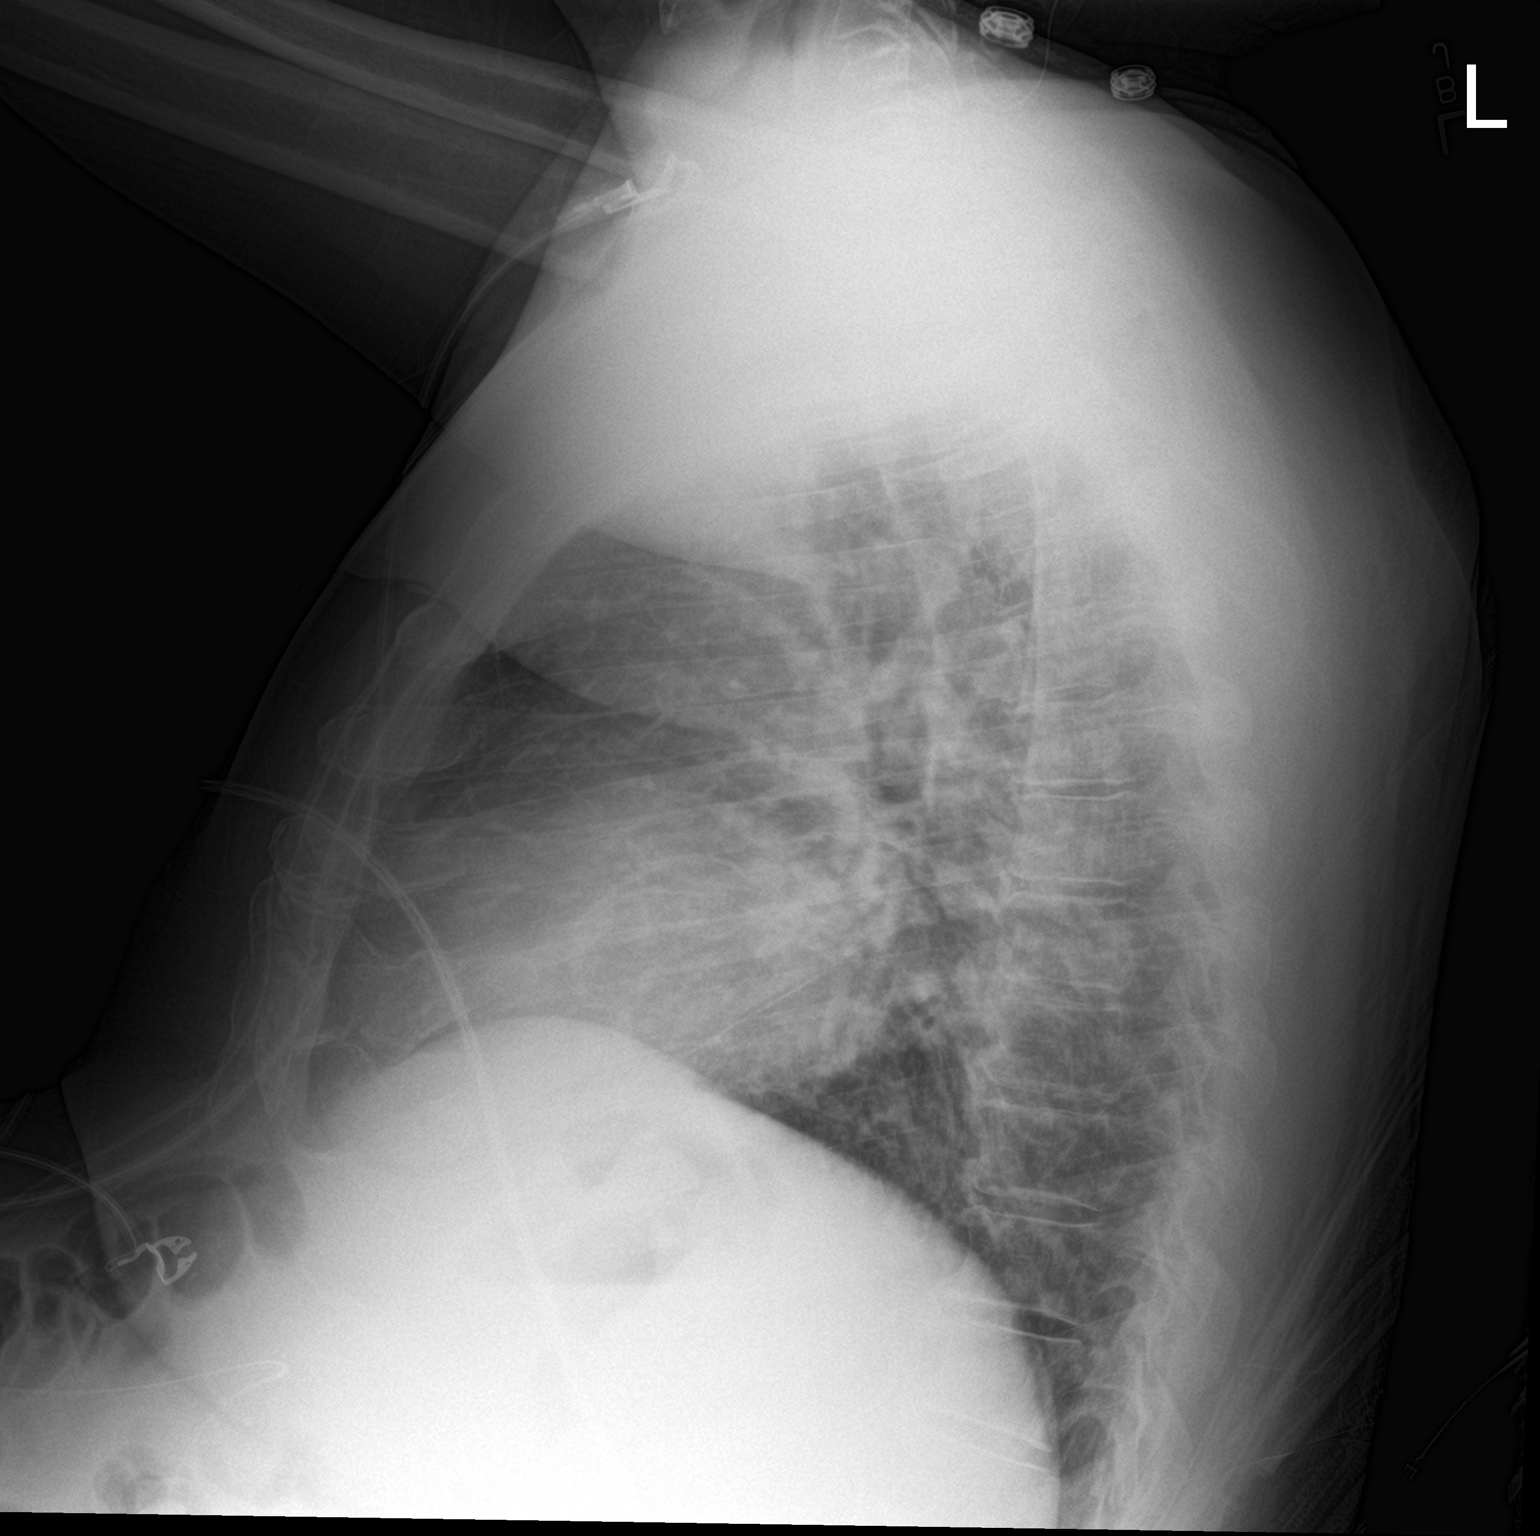

[chest ap]
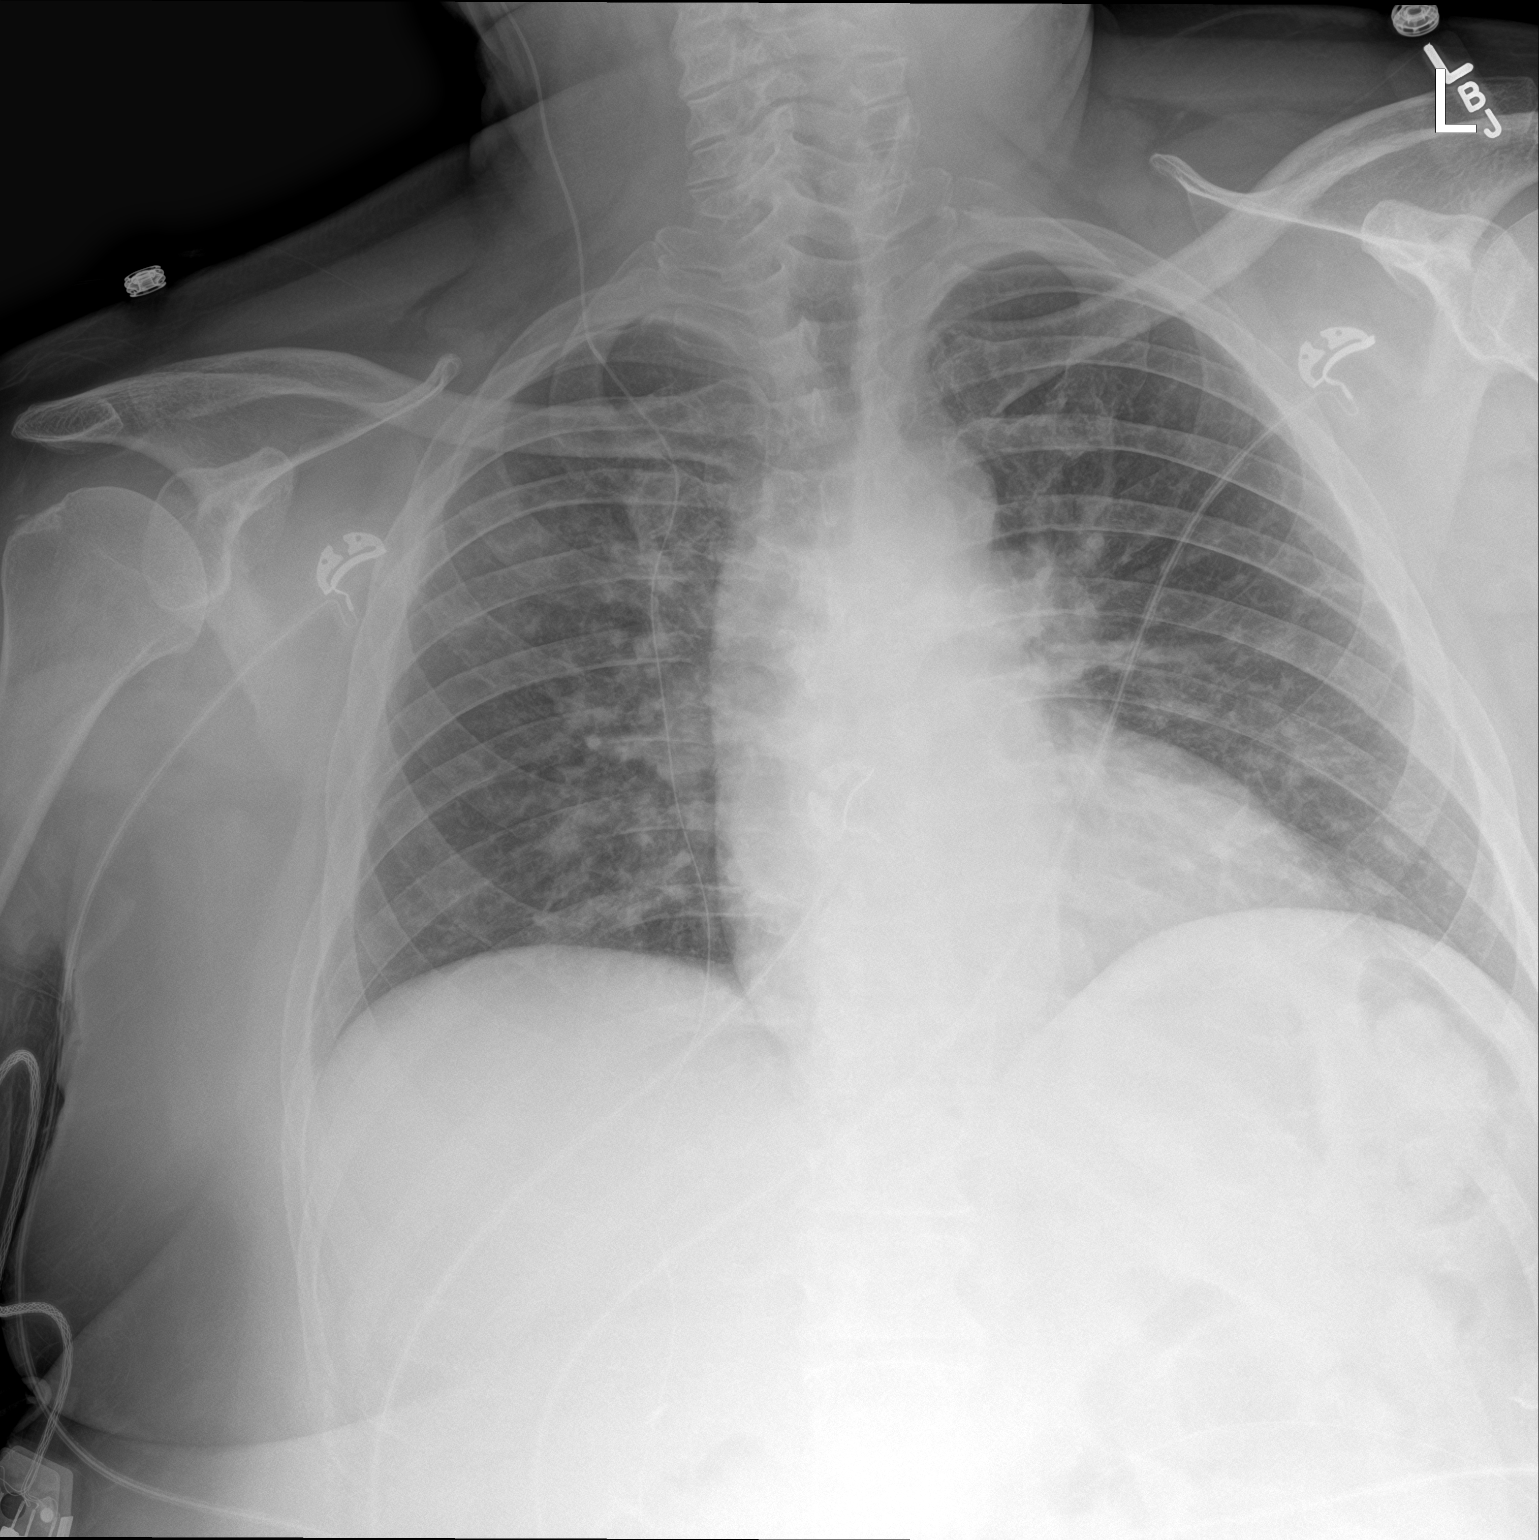

[2 of 2 positions shown; findings below may reference images not displayed]

FINDINGS: Since the prior study, the endotracheal tube and nasal/orogastric
tube have been removed.

Interstitial and subtle airspace opacities noted in the right lung
on the prior study have improved. No new lung abnormalities.

No convincing pleural effusion and no pneumothorax.

Stable right anterior chest wall VP shunt.
IMPRESSION: 1. Improved lung aeration with a decrease in interstitial airspace
opacities consistent with either improved pneumonia or or edema.
2. No new abnormalities.
3. Status post extubation.

## 2022-06-11 IMAGING — DX DG CHEST 1V PORT
1 series · 1 of 1 positions shown · non-contrast
Comparison: February 06, 2021

CLINICAL DATA: Shortness of breath

EXAM:
PORTABLE CHEST 1 VIEW

[chest ap]
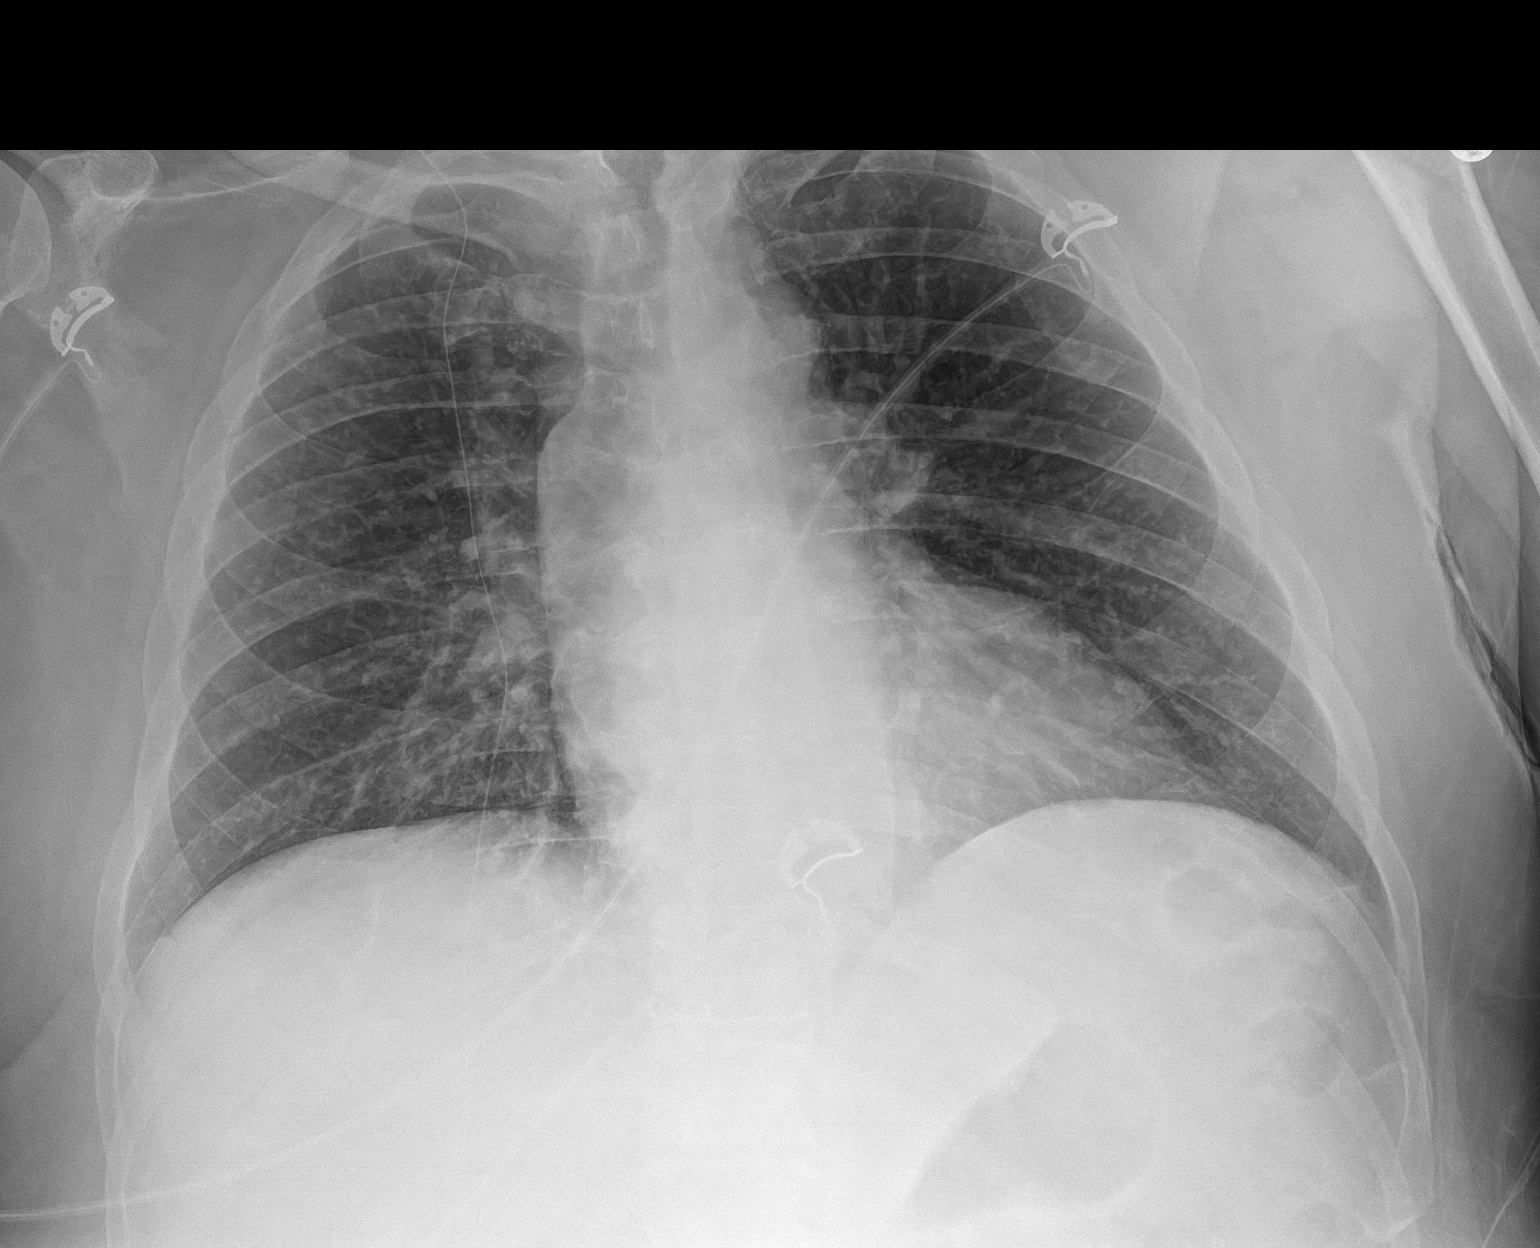

[1 of 1 positions shown; findings below may reference images not displayed]

FINDINGS: Ventriculoperitoneal shunt on the right unchanged in positioning. No
edema or airspace opacity. Heart size and pulmonary vascularity are
normal. No adenopathy. There is degenerative change in the thoracic
spine.
IMPRESSION: Currently lungs clear. Cardiac silhouette normal.
Ventriculoperitoneal shunt catheter again noted on the right.
